# Patient Record
Sex: Female | Born: 1980 | Race: Black or African American | Hispanic: No | Marital: Married | Smoking: Never smoker
Health system: Southern US, Community
[De-identification: ages and names within clinical notes are randomized; demographics above are authoritative.]

## PROBLEM LIST (undated history)

## (undated) DIAGNOSIS — I509 Heart failure, unspecified: Secondary | ICD-10-CM

## (undated) DIAGNOSIS — M311 Thrombotic microangiopathy: Secondary | ICD-10-CM

## (undated) DIAGNOSIS — Z5189 Encounter for other specified aftercare: Secondary | ICD-10-CM

## (undated) DIAGNOSIS — I10 Essential (primary) hypertension: Secondary | ICD-10-CM

## (undated) DIAGNOSIS — M329 Systemic lupus erythematosus, unspecified: Secondary | ICD-10-CM

## (undated) DIAGNOSIS — M3119 Other thrombotic microangiopathy: Secondary | ICD-10-CM

## (undated) DIAGNOSIS — R569 Unspecified convulsions: Secondary | ICD-10-CM

---

## 2001-11-09 ENCOUNTER — Emergency Department (HOSPITAL_COMMUNITY): Admission: EM | Admit: 2001-11-09 | Discharge: 2001-11-10 | Payer: Self-pay | Admitting: Emergency Medicine

## 2009-03-04 ENCOUNTER — Ambulatory Visit: Payer: Self-pay | Admitting: Family Medicine

## 2011-05-09 ENCOUNTER — Encounter (HOSPITAL_BASED_OUTPATIENT_CLINIC_OR_DEPARTMENT_OTHER): Payer: Self-pay | Admitting: *Deleted

## 2011-05-09 ENCOUNTER — Emergency Department (HOSPITAL_BASED_OUTPATIENT_CLINIC_OR_DEPARTMENT_OTHER)
Admission: EM | Admit: 2011-05-09 | Discharge: 2011-05-09 | Disposition: A | Discharge status: Home / Self Care | Payer: Self-pay | Attending: Emergency Medicine | Admitting: Emergency Medicine

## 2011-05-09 DIAGNOSIS — M329 Systemic lupus erythematosus, unspecified: Secondary | ICD-10-CM | POA: Insufficient documentation

## 2011-05-09 DIAGNOSIS — I509 Heart failure, unspecified: Secondary | ICD-10-CM | POA: Insufficient documentation

## 2011-05-09 DIAGNOSIS — I10 Essential (primary) hypertension: Secondary | ICD-10-CM | POA: Insufficient documentation

## 2011-05-09 DIAGNOSIS — Z79899 Other long term (current) drug therapy: Secondary | ICD-10-CM | POA: Insufficient documentation

## 2011-05-09 DIAGNOSIS — J029 Acute pharyngitis, unspecified: Secondary | ICD-10-CM | POA: Insufficient documentation

## 2011-05-09 HISTORY — DX: Essential (primary) hypertension: I10

## 2011-05-09 HISTORY — DX: Heart failure, unspecified: I50.9

## 2011-05-09 HISTORY — DX: Systemic lupus erythematosus, unspecified: M32.9

## 2011-05-09 LAB — RAPID STREP SCREEN (MED CTR MEBANE ONLY): Streptococcus, Group A Screen (Direct): NEGATIVE

## 2011-05-09 MED ORDER — CLINDAMYCIN HCL 150 MG PO CAPS: 150.0000 mg | ORAL_CAPSULE | Freq: Four times a day (QID) | ORAL | Status: AC

## 2011-05-09 MED ORDER — HYDROCODONE-ACETAMINOPHEN 7.5-500 MG/15ML PO SOLN
10.0000 mL | Freq: Once | ORAL | Status: AC
  Administered 2011-05-09: 10 mL via ORAL
  Filled 2011-05-09: qty 15

## 2011-05-09 NOTE — ED Provider Notes (Signed)
History     CSN: [id number]  Arrival date & time 05/09/11  2236   First MD Initiated Contact with Patient 05/09/11 2301      Chief Complaint  Patient presents with  . Sore Throat    The history is provided by the patient.   the patient for 6 days of sore throat with pain with swallowing.  She denies fevers or chills.  She denies nausea vomiting or diarrhea.  She's been able tolerate fluids without difficulty.  She reports no changes in her voice.  Nothing worsens her symptoms.  Nothing improves her symptoms.  Her symptoms are constant.  She's denies neck pain and neck lymphadenopathy.  She has no dental complaints.   Past Medical History  Diagnosis Date  . Lupus   . CHF (congestive heart failure)   . Hypertension     History reviewed. No pertinent past surgical history.  History reviewed. No pertinent family history.  History  Substance Use Topics  . Smoking status: Never Smoker   . Smokeless tobacco: Not on file  . Alcohol Use: No    OB History    Grav Para Term Preterm Abortions TAB SAB Ect Mult Living                  Review of Systems  All other systems reviewed and are negative.    Allergies  Morphine and related  Home Medications   Current Outpatient Rx  Name Route Sig Dispense Refill  . ASPIRIN 81 MG PO TABS Oral Take 81 mg by mouth daily.    Marland Kitchen HYDROXYCHLOROQUINE SULFATE 200 MG PO TABS Oral Take 400 mg by mouth daily.    Marland Kitchen LISINOPRIL 10 MG PO TABS Oral Take 10 mg by mouth daily.    . ADULT MULTIVITAMIN W/MINERALS CH Oral Take 1 tablet by mouth daily.    Marland Kitchen MYCOPHENOLATE MOFETIL 500 MG PO TABS Oral Take 1,000 mg by mouth 2 (two) times daily.    Marland Kitchen NAPROXEN SODIUM 220 MG PO TABS Oral Take 440 mg by mouth daily as needed. For pain    . NITROFURANTOIN MONOHYD MACRO 100 MG PO CAPS Oral Take 100 mg by mouth daily. For 14 days starting 05/03/2011    . PREDNISONE 2.5 MG PO TABS Oral Take 7.5 mg by mouth daily.    Marland Kitchen CLINDAMYCIN HCL 150 MG PO CAPS Oral Take 1  capsule (150 mg total) by mouth every 6 (six) hours. 28 capsule 0    BP 118/80  Pulse 85  Temp(Src) 98.7 F (37.1 C) (Oral)  Resp 16  Ht 5\' 9"  (1.753 m)  Wt 166 lb (75.297 kg)  BMI 24.51 kg/m2  SpO2 100%  LMP 05/02/2011  Physical Exam  Nursing note and vitals reviewed. Constitutional: She is oriented to person, place, and time. She appears well-developed and well-nourished. No distress.  HENT:  Head: Normocephalic and atraumatic.  Right Ear: Tympanic membrane, external ear and ear canal normal.  Left Ear: Tympanic membrane, external ear and ear canal normal.  Mouth/Throat: Uvula is midline and mucous membranes are normal. Mucous membranes are not dry. Dental caries present. No dental abscesses. Posterior oropharyngeal erythema present. No oropharyngeal exudate, posterior oropharyngeal edema or tonsillar abscesses.  Eyes: EOM are normal.  Neck: Normal range of motion.  Cardiovascular: Normal rate, regular rhythm and normal heart sounds.   Pulmonary/Chest: Effort normal and breath sounds normal.  Abdominal: Soft. She exhibits no distension. There is no tenderness.  Musculoskeletal: Normal range of motion.  Neurological: She is alert and oriented to person, place, and time.  Skin: Skin is warm and dry.  Psychiatric: She has a normal mood and affect. Judgment normal.    ED Course  Procedures (including critical care time)   Labs Reviewed  RAPID STREP SCREEN   No results found.   1. Pharyngitis       MDM  We'll cover the patient for possible early bacterial pharyngitis.  Clindamycin given.  Pain treated.  Uvula midline.  No evidence of peritonsillar abscess.  Tolerating secretions         Lyanne Co, MD 05/10/11 [phone]

## 2011-05-09 NOTE — ED Notes (Signed)
Pt c/o sore throat x 6 days

## 2012-02-05 ENCOUNTER — Encounter (HOSPITAL_BASED_OUTPATIENT_CLINIC_OR_DEPARTMENT_OTHER): Payer: Self-pay | Admitting: *Deleted

## 2012-02-05 ENCOUNTER — Emergency Department (HOSPITAL_BASED_OUTPATIENT_CLINIC_OR_DEPARTMENT_OTHER)
Admission: EM | Admit: 2012-02-05 | Discharge: 2012-02-05 | Disposition: A | Discharge status: Home / Self Care | Payer: Self-pay | Attending: Emergency Medicine | Admitting: Emergency Medicine

## 2012-02-05 ENCOUNTER — Emergency Department (HOSPITAL_BASED_OUTPATIENT_CLINIC_OR_DEPARTMENT_OTHER): Payer: Self-pay

## 2012-02-05 DIAGNOSIS — R509 Fever, unspecified: Secondary | ICD-10-CM | POA: Insufficient documentation

## 2012-02-05 DIAGNOSIS — R059 Cough, unspecified: Secondary | ICD-10-CM | POA: Insufficient documentation

## 2012-02-05 DIAGNOSIS — I509 Heart failure, unspecified: Secondary | ICD-10-CM | POA: Insufficient documentation

## 2012-02-05 DIAGNOSIS — IMO0002 Reserved for concepts with insufficient information to code with codable children: Secondary | ICD-10-CM | POA: Insufficient documentation

## 2012-02-05 DIAGNOSIS — J189 Pneumonia, unspecified organism: Secondary | ICD-10-CM | POA: Insufficient documentation

## 2012-02-05 DIAGNOSIS — Z7982 Long term (current) use of aspirin: Secondary | ICD-10-CM | POA: Insufficient documentation

## 2012-02-05 DIAGNOSIS — I10 Essential (primary) hypertension: Secondary | ICD-10-CM | POA: Insufficient documentation

## 2012-02-05 DIAGNOSIS — Z79899 Other long term (current) drug therapy: Secondary | ICD-10-CM | POA: Insufficient documentation

## 2012-02-05 DIAGNOSIS — J029 Acute pharyngitis, unspecified: Secondary | ICD-10-CM | POA: Insufficient documentation

## 2012-02-05 DIAGNOSIS — R05 Cough: Secondary | ICD-10-CM | POA: Insufficient documentation

## 2012-02-05 LAB — CBC WITH DIFFERENTIAL/PLATELET
Basophils Absolute: 0 10*3/uL (ref 0.0–0.1)
Eosinophils Absolute: 0.1 10*3/uL (ref 0.0–0.7)
Lymphocytes Relative: 10 % — ABNORMAL LOW (ref 12–46)
MCHC: 32.9 g/dL (ref 30.0–36.0)
Neutro Abs: 2.4 10*3/uL (ref 1.7–7.7)
Neutrophils Relative %: 80 % — ABNORMAL HIGH (ref 43–77)
Platelets: 199 10*3/uL (ref 150–400)
RDW: 16.9 % — ABNORMAL HIGH (ref 11.5–15.5)
WBC: 3 10*3/uL — ABNORMAL LOW (ref 4.0–10.5)

## 2012-02-05 LAB — COMPREHENSIVE METABOLIC PANEL
ALT: 13 U/L (ref 0–35)
AST: 32 U/L (ref 0–37)
Albumin: 2.8 g/dL — ABNORMAL LOW (ref 3.5–5.2)
Chloride: 105 mEq/L (ref 96–112)
Creatinine, Ser: 0.6 mg/dL (ref 0.50–1.10)
Potassium: 3.6 mEq/L (ref 3.5–5.1)
Sodium: 139 mEq/L (ref 135–145)
Total Bilirubin: 0.2 mg/dL — ABNORMAL LOW (ref 0.3–1.2)

## 2012-02-05 LAB — RAPID STREP SCREEN (MED CTR MEBANE ONLY): Streptococcus, Group A Screen (Direct): NEGATIVE

## 2012-02-05 MED ORDER — SODIUM CHLORIDE 0.9 % IV SOLN
Freq: Once | INTRAVENOUS | Status: AC
  Administered 2012-02-05: 22:00:00 via INTRAVENOUS

## 2012-02-05 MED ORDER — DEXTROSE 5 % IV SOLN
1.0000 g | INTRAVENOUS | Status: DC
  Administered 2012-02-05: 1 g via INTRAVENOUS
  Filled 2012-02-05: qty 10

## 2012-02-05 MED ORDER — DIPHENHYDRAMINE HCL 50 MG/ML IJ SOLN
25.0000 mg | Freq: Once | INTRAMUSCULAR | Status: AC
  Administered 2012-02-05: 25 mg via INTRAVENOUS

## 2012-02-05 MED ORDER — OXYCODONE-ACETAMINOPHEN 5-325 MG PO TABS
2.0000 | ORAL_TABLET | Freq: Once | ORAL | Status: AC
  Administered 2012-02-05: 2 via ORAL
  Filled 2012-02-05: qty 2

## 2012-02-05 MED ORDER — OXYCODONE-ACETAMINOPHEN 5-325 MG PO TABS
ORAL_TABLET | ORAL | Status: AC
  Administered 2012-02-05: 2 via ORAL
  Filled 2012-02-05: qty 2

## 2012-02-05 MED ORDER — AZITHROMYCIN 250 MG PO TABS: 250.0000 mg | ORAL_TABLET | Freq: Every day | ORAL | Status: DC

## 2012-02-05 MED ORDER — DIPHENHYDRAMINE HCL 50 MG/ML IJ SOLN
INTRAMUSCULAR | Status: AC
  Administered 2012-02-05: 25 mg via INTRAVENOUS
  Filled 2012-02-05: qty 1

## 2012-02-05 MED ORDER — OXYCODONE-ACETAMINOPHEN 5-325 MG PO TABS: 2.0000 | ORAL_TABLET | ORAL | Status: DC | PRN

## 2012-02-05 MED ORDER — DEXTROSE 5 % IV SOLN
500.0000 mg | INTRAVENOUS | Status: DC
  Administered 2012-02-05: 500 mg via INTRAVENOUS
  Filled 2012-02-05: qty 500

## 2012-02-05 NOTE — ED Notes (Signed)
Pt given ice water per request.

## 2012-02-05 NOTE — ED Notes (Signed)
Lupus flare-up. Sore throat. Aching all over. Symptoms since yesterday. Worse today.

## 2012-02-05 NOTE — ED Notes (Signed)
Pt returned from radiology via stretcher

## 2012-02-05 NOTE — ED Provider Notes (Signed)
History     CSN: [id number]  Arrival date & time 02/05/12  1839   None     Chief Complaint  Patient presents with  . Lupus    (Consider location/radiation/quality/duration/timing/severity/associated sxs/prior treatment) Patient is a 31 y.o. female presenting with cough. The history is provided by the patient. No language interpreter was used.  Cough This is a new problem. The current episode started yesterday. The problem occurs constantly. The problem has been gradually worsening. The cough is non-productive. The maximum temperature recorded prior to her arrival was 100 to 100.9 F. The fever has been present for less than 1 day. Associated symptoms include sore throat. She has tried nothing for the symptoms. She is not a smoker. Her past medical history does not include pneumonia.  Pt complains of a sore throat and cough  Past Medical History  Diagnosis Date  . Lupus   . CHF (congestive heart failure)   . Hypertension     History reviewed. No pertinent past surgical history.  No family history on file.  History  Substance Use Topics  . Smoking status: Never Smoker   . Smokeless tobacco: Not on file  . Alcohol Use: No    OB History    Grav Para Term Preterm Abortions TAB SAB Ect Mult Living                  Review of Systems  HENT: Positive for sore throat.   Respiratory: Positive for cough.   All other systems reviewed and are negative.    Allergies  Morphine and related  Home Medications   Current Outpatient Rx  Name  Route  Sig  Dispense  Refill  . ASPIRIN 81 MG PO TABS   Oral   Take 81 mg by mouth daily.         Marland Kitchen HYDROXYCHLOROQUINE SULFATE 200 MG PO TABS   Oral   Take 400 mg by mouth daily.         Marland Kitchen LISINOPRIL 10 MG PO TABS   Oral   Take 10 mg by mouth daily.         . ADULT MULTIVITAMIN W/MINERALS CH   Oral   Take 1 tablet by mouth daily.         Marland Kitchen MYCOPHENOLATE MOFETIL 500 MG PO TABS   Oral   Take 1,000 mg by mouth 2 (two)  times daily.         Marland Kitchen NAPROXEN SODIUM 220 MG PO TABS   Oral   Take 440 mg by mouth daily as needed. For pain         . NITROFURANTOIN MONOHYD MACRO 100 MG PO CAPS   Oral   Take 100 mg by mouth daily. For 14 days starting 05/03/2011         . PREDNISONE 2.5 MG PO TABS   Oral   Take 7.5 mg by mouth daily.           BP 102/64  Pulse 108  Temp 100.4 F (38 C) (Oral)  Resp 20  SpO2 100%  Physical Exam  Nursing note and vitals reviewed. Constitutional: She is oriented to person, place, and time. She appears well-developed and well-nourished.  HENT:  Head: Normocephalic and atraumatic.  Eyes: Conjunctivae normal and EOM are normal. Pupils are equal, round, and reactive to light.  Neck: Normal range of motion. Neck supple.  Cardiovascular: Normal rate and normal heart sounds.   Pulmonary/Chest: Effort normal and breath sounds normal.  Abdominal: Soft.  Musculoskeletal: Normal range of motion.  Neurological: She is alert and oriented to person, place, and time. She has normal reflexes.  Skin: Skin is warm.  Psychiatric: She has a normal mood and affect.    ED Course  Procedures (including critical care time)  Labs Reviewed - No data to display No results found.   No diagnosis found.    MDM   Results for orders placed during the hospital encounter of 02/05/12  RAPID STREP SCREEN      Component Value Range   Streptococcus, Group A Screen (Direct) NEGATIVE  NEGATIVE  CBC WITH DIFFERENTIAL      Component Value Range   WBC 3.0 (*) 4.0 - 10.5 K/uL   RBC 3.04 (*) 3.87 - 5.11 MIL/uL   Hemoglobin 8.0 (*) 12.0 - 15.0 g/dL   HCT 19.1 (*) 47.8 - 29.5 %   MCV 79.9  78.0 - 100.0 fL   MCH 26.3  26.0 - 34.0 pg   MCHC 32.9  30.0 - 36.0 g/dL   RDW 62.1 (*) 30.8 - 65.7 %   Platelets 199  150 - 400 K/uL   Neutrophils Relative 80 (*) 43 - 77 %   Lymphocytes Relative 10 (*) 12 - 46 %   Monocytes Relative 6  3 - 12 %   Eosinophils Relative 4  0 - 5 %   Basophils  Relative 0  0 - 1 %   Neutro Abs 2.4  1.7 - 7.7 K/uL   Lymphs Abs 0.3 (*) 0.7 - 4.0 K/uL   Monocytes Absolute 0.2  0.1 - 1.0 K/uL   Eosinophils Absolute 0.1  0.0 - 0.7 K/uL   Basophils Absolute 0.0  0.0 - 0.1 K/uL  COMPREHENSIVE METABOLIC PANEL      Component Value Range   Sodium 139  135 - 145 mEq/L   Potassium 3.6  3.5 - 5.1 mEq/L   Chloride 105  96 - 112 mEq/L   CO2 24  19 - 32 mEq/L   Glucose, Bld 108 (*) 70 - 99 mg/dL   BUN 6  6 - 23 mg/dL   Creatinine, Ser 8.46  0.50 - 1.10 mg/dL   Calcium 8.2 (*) 8.4 - 10.5 mg/dL   Total Protein 7.8  6.0 - 8.3 g/dL   Albumin 2.8 (*) 3.5 - 5.2 g/dL   AST 32  0 - 37 U/L   ALT 13  0 - 35 U/L   Alkaline Phosphatase 50  39 - 117 U/L   Total Bilirubin 0.2 (*) 0.3 - 1.2 mg/dL   GFR calc non Af Amer >90  >90 mL/min   GFR calc Af Amer >90  >90 mL/min   Dg Chest 2 View  02/05/2012  *RADIOLOGY REPORT*  Clinical Data: Cough and congestion.  CHEST - 2 VIEW  Comparison: No priors.  Findings: Ill-defined peripheral opacity in the right base difficult to localize on the lateral projection.  Blunting of the left costophrenic sulcus suggestive of a peripherally loculated left pleural effusion.  Pulmonary vasculature is normal.  Heart size is normal.  Mediastinal contours are unremarkable.  IMPRESSION: 1.  Ill-defined opacity in the lateral aspect of the right base may represent an area of early airspace consolidation or scarring in either the lateral segment of the right middle lobe or the lateral aspect of the right lower lobe. 2. Small peripherally loculated left pleural effusion.   Original Report Authenticated By: Trudie Reed, M.D.       Pt given  IV rocephin and zithromax IV.   I advised follow up with her Md for recheck in the next 2 days.  Pt given rx for zithromax and percocet.  Pt advised to return if symptoms worsen or change     Elson Areas, [place] 02/05/12 2317  Lonia Skinner [place], [place] 02/05/12 2318

## 2012-02-06 NOTE — ED Provider Notes (Signed)
Medical screening examination/treatment/procedure(s) were performed by non-physician practitioner and as supervising physician I was immediately available for consultation/collaboration.   Maverick Patman, MD 02/06/12 0017 

## 2012-02-16 ENCOUNTER — Emergency Department (HOSPITAL_BASED_OUTPATIENT_CLINIC_OR_DEPARTMENT_OTHER)
Admission: EM | Admit: 2012-02-16 | Discharge: 2012-02-17 | Disposition: A | Discharge status: Home / Self Care | Payer: Self-pay | Attending: Emergency Medicine | Admitting: Emergency Medicine

## 2012-02-16 ENCOUNTER — Encounter (HOSPITAL_BASED_OUTPATIENT_CLINIC_OR_DEPARTMENT_OTHER): Payer: Self-pay | Admitting: *Deleted

## 2012-02-16 ENCOUNTER — Emergency Department (HOSPITAL_BASED_OUTPATIENT_CLINIC_OR_DEPARTMENT_OTHER): Payer: Self-pay

## 2012-02-16 DIAGNOSIS — G40909 Epilepsy, unspecified, not intractable, without status epilepticus: Secondary | ICD-10-CM | POA: Insufficient documentation

## 2012-02-16 DIAGNOSIS — R110 Nausea: Secondary | ICD-10-CM | POA: Insufficient documentation

## 2012-02-16 DIAGNOSIS — Z7982 Long term (current) use of aspirin: Secondary | ICD-10-CM | POA: Insufficient documentation

## 2012-02-16 DIAGNOSIS — R6883 Chills (without fever): Secondary | ICD-10-CM | POA: Insufficient documentation

## 2012-02-16 DIAGNOSIS — J189 Pneumonia, unspecified organism: Secondary | ICD-10-CM

## 2012-02-16 DIAGNOSIS — Z79899 Other long term (current) drug therapy: Secondary | ICD-10-CM | POA: Insufficient documentation

## 2012-02-16 DIAGNOSIS — R05 Cough: Secondary | ICD-10-CM | POA: Insufficient documentation

## 2012-02-16 DIAGNOSIS — J159 Unspecified bacterial pneumonia: Secondary | ICD-10-CM | POA: Insufficient documentation

## 2012-02-16 DIAGNOSIS — Z862 Personal history of diseases of the blood and blood-forming organs and certain disorders involving the immune mechanism: Secondary | ICD-10-CM | POA: Insufficient documentation

## 2012-02-16 DIAGNOSIS — R000 Tachycardia, unspecified: Secondary | ICD-10-CM | POA: Insufficient documentation

## 2012-02-16 DIAGNOSIS — I10 Essential (primary) hypertension: Secondary | ICD-10-CM | POA: Insufficient documentation

## 2012-02-16 DIAGNOSIS — IMO0001 Reserved for inherently not codable concepts without codable children: Secondary | ICD-10-CM | POA: Insufficient documentation

## 2012-02-16 DIAGNOSIS — R059 Cough, unspecified: Secondary | ICD-10-CM | POA: Insufficient documentation

## 2012-02-16 DIAGNOSIS — I509 Heart failure, unspecified: Secondary | ICD-10-CM | POA: Insufficient documentation

## 2012-02-16 DIAGNOSIS — M329 Systemic lupus erythematosus, unspecified: Secondary | ICD-10-CM | POA: Insufficient documentation

## 2012-02-16 HISTORY — DX: Thrombotic microangiopathy: M31.1

## 2012-02-16 HISTORY — DX: Other thrombotic microangiopathy: M31.19

## 2012-02-16 HISTORY — DX: Unspecified convulsions: R56.9

## 2012-02-16 HISTORY — DX: Encounter for other specified aftercare: Z51.89

## 2012-02-16 LAB — URINALYSIS, ROUTINE W REFLEX MICROSCOPIC
Glucose, UA: NEGATIVE mg/dL
Protein, ur: 100 mg/dL — AB
pH: 6.5 (ref 5.0–8.0)

## 2012-02-16 LAB — CBC WITH DIFFERENTIAL/PLATELET
Basophils Relative: 0 % (ref 0–1)
Eosinophils Relative: 2 % (ref 0–5)
Lymphs Abs: 0.5 10*3/uL — ABNORMAL LOW (ref 0.7–4.0)
MCH: 26.4 pg (ref 26.0–34.0)
MCV: 79.4 fL (ref 78.0–100.0)
Monocytes Absolute: 0.1 10*3/uL (ref 0.1–1.0)
Platelets: 281 10*3/uL (ref 150–400)
RBC: 3.49 MIL/uL — ABNORMAL LOW (ref 3.87–5.11)

## 2012-02-16 LAB — COMPREHENSIVE METABOLIC PANEL
Alkaline Phosphatase: 45 U/L (ref 39–117)
BUN: 11 mg/dL (ref 6–23)
Chloride: 101 mEq/L (ref 96–112)
GFR calc Af Amer: >90 mL/min (ref >90)
Glucose, Bld: 80 mg/dL (ref 70–99)
Potassium: 4 mEq/L (ref 3.5–5.1)
Total Bilirubin: 0.3 mg/dL (ref 0.3–1.2)

## 2012-02-16 LAB — URINE MICROSCOPIC-ADD ON

## 2012-02-16 LAB — LACTIC ACID, PLASMA: Lactic Acid, Venous: 1.2 mmol/L (ref 0.5–2.2)

## 2012-02-16 MED ORDER — LEVOFLOXACIN IN D5W 750 MG/150ML IV SOLN
750.0000 mg | Freq: Once | INTRAVENOUS | Status: DC
  Filled 2012-02-16: qty 150

## 2012-02-16 MED ORDER — ACETAMINOPHEN 325 MG PO TABS
ORAL_TABLET | ORAL | Status: AC
  Administered 2012-02-16: 650 mg via ORAL
  Filled 2012-02-16: qty 2

## 2012-02-16 MED ORDER — LEVOFLOXACIN 750 MG PO TABS: 750.0000 mg | ORAL_TABLET | Freq: Every day | ORAL | Status: DC

## 2012-02-16 MED ORDER — DIPHENHYDRAMINE HCL 25 MG PO CAPS
25.0000 mg | ORAL_CAPSULE | Freq: Once | ORAL | Status: AC
  Administered 2012-02-16: 25 mg via ORAL
  Filled 2012-02-16: qty 1

## 2012-02-16 MED ORDER — SODIUM CHLORIDE 0.9 % IV BOLUS (SEPSIS)
1000.0000 mL | Freq: Once | INTRAVENOUS | Status: AC
  Administered 2012-02-16: 1000 mL via INTRAVENOUS

## 2012-02-16 MED ORDER — CEFTRIAXONE SODIUM 1 G IJ SOLR: 1.0000 g | Freq: Once | INTRAMUSCULAR | Status: DC

## 2012-02-16 MED ORDER — KETOROLAC TROMETHAMINE 30 MG/ML IJ SOLN
30.0000 mg | Freq: Once | INTRAMUSCULAR | Status: AC
  Administered 2012-02-16: 30 mg via INTRAVENOUS
  Filled 2012-02-16: qty 1

## 2012-02-16 MED ORDER — ACETAMINOPHEN 325 MG PO TABS
650.0000 mg | ORAL_TABLET | Freq: Once | ORAL | Status: AC
  Administered 2012-02-16: 650 mg via ORAL

## 2012-02-16 MED ORDER — DEXTROSE 5 % IV SOLN: 500.0000 mg | Freq: Once | INTRAVENOUS | Status: DC

## 2012-02-16 NOTE — ED Notes (Signed)
Fever and bodyaches x 1 days- nausea, denies vomiting

## 2012-02-16 NOTE — ED Provider Notes (Signed)
History     CSN: [id number]  Arrival date & time 02/16/12  2048   First MD Initiated Contact with Patient 02/16/12 2107      Chief Complaint  Patient presents with  . Fever    (Consider location/radiation/quality/duration/timing/severity/associated sxs/prior treatment) The history is provided by the patient.  Suzanne Tyler is a 31 y.o. female hx of lupus on plaquinel and Cellcept, TTP, pericardial effusion and CHF that resolved, here with fever. Subjective fever today. She also has myalgias. She has some nonproductive cough and felt nauseous but didn't vomit. No diarrhea or abdominal pain. Was recently treated for possible pneumonia and finished zithromax a week ago. No headache or stiff neck or recent travel.    Past Medical History  Diagnosis Date  . Lupus   . CHF (congestive heart failure)   . Hypertension   . T.T.P. syndrome   . Blood transfusion without reported diagnosis   . Seizures     History reviewed. No pertinent past surgical history.  No family history on file.  History  Substance Use Topics  . Smoking status: Never Smoker   . Smokeless tobacco: Never Used  . Alcohol Use: No    OB History    Grav Para Term Preterm Abortions TAB SAB Ect Mult Living                  Review of Systems  Constitutional: Positive for fever, chills and fatigue.  Respiratory: Positive for cough.   Gastrointestinal: Positive for nausea.  All other systems reviewed and are negative.    Allergies  Morphine and related  Home Medications   Current Outpatient Rx  Name  Route  Sig  Dispense  Refill  . ASPIRIN 81 MG PO TABS   Oral   Take 81 mg by mouth daily.         Marland Kitchen HYDROXYCHLOROQUINE SULFATE 200 MG PO TABS   Oral   Take 400 mg by mouth daily.         Marland Kitchen LISINOPRIL 10 MG PO TABS   Oral   Take 10 mg by mouth daily.         . ADULT MULTIVITAMIN W/MINERALS CH   Oral   Take 1 tablet by mouth daily.         Marland Kitchen MYCOPHENOLATE MOFETIL 500 MG PO TABS  Oral   Take 1,000 mg by mouth 2 (two) times daily.         Marland Kitchen NAPROXEN SODIUM 220 MG PO TABS   Oral   Take 440 mg by mouth daily as needed. For pain         . OXYCODONE-ACETAMINOPHEN 5-325 MG PO TABS   Oral   Take 2 tablets by mouth every 4 (four) hours as needed for pain.   16 tablet   0   . PREDNISONE 2.5 MG PO TABS   Oral   Take 7.5 mg by mouth daily.         . AZITHROMYCIN 250 MG PO TABS   Oral   Take 1 tablet (250 mg total) by mouth daily.   4 each   0   . NITROFURANTOIN MONOHYD MACRO 100 MG PO CAPS   Oral   Take 100 mg by mouth daily. For 14 days starting 05/03/2011           BP 100/60  Pulse 131  Temp 102.9 F (39.4 C) (Oral)  Resp 20  Ht 5\' 9"  (1.753 m)  Wt 166 lb (75.297 kg)  BMI 24.51 kg/m2  SpO2 99%  LMP 01/29/2012  Physical Exam  Nursing note and vitals reviewed. Constitutional: She is oriented to person, place, and time. She appears well-developed and well-nourished.       Uncomfortable, shivering.   HENT:  Head: Normocephalic.  Mouth/Throat: Oropharynx is clear and moist.  Eyes: Conjunctivae normal are normal. Pupils are equal, round, and reactive to light.  Neck: Normal range of motion. Neck supple.  Cardiovascular: Regular rhythm and normal heart sounds.        Tachycardic   Pulmonary/Chest: Effort normal and breath sounds normal. No respiratory distress. She has no wheezes. She has no rales.  Abdominal: Soft. Bowel sounds are normal. She exhibits no distension. There is no tenderness. There is no rebound.  Musculoskeletal: Normal range of motion. She exhibits no edema and no tenderness.  Neurological: She is alert and oriented to person, place, and time.  Skin: Skin is warm and dry.  Psychiatric: She has a normal mood and affect. Her behavior is normal. Judgment and thought content normal.    ED Course  Procedures (including critical care time)  Labs Reviewed  CBC WITH DIFFERENTIAL - Abnormal; Notable for the following:    WBC 2.7  (*)     RBC 3.49 (*)     Hemoglobin 9.2 (*)     HCT 27.7 (*)     RDW 17.7 (*)     Lymphs Abs 0.5 (*)     All other components within normal limits  COMPREHENSIVE METABOLIC PANEL - Abnormal; Notable for the following:    Total Protein 8.4 (*)     Albumin 3.1 (*)     All other components within normal limits  URINALYSIS, ROUTINE W REFLEX MICROSCOPIC - Abnormal; Notable for the following:    APPearance CLOUDY (*)     Bilirubin Urine SMALL (*)     Ketones, ur 15 (*)     Protein, ur 100 (*)     Leukocytes, UA SMALL (*)     All other components within normal limits  URINE MICROSCOPIC-ADD ON - Abnormal; Notable for the following:    Squamous Epithelial / LPF FEW (*)     Bacteria, UA FEW (*)     All other components within normal limits  LACTIC ACID, PLASMA  URINE CULTURE  CULTURE, BLOOD (ROUTINE X 2)  CULTURE, BLOOD (ROUTINE X 2)   Dg Chest 2 View  02/16/2012  *RADIOLOGY REPORT*  Clinical Data: Fever and myalgias.  Nausea.  CHEST - 2 VIEW  Comparison: 02/05/2012.  Findings: Normal sized heart.  Minimal patchy opacity at the left lung base, laterally, with improvement.  Small amount of adjacent pleural thickening or fluid, also improved.  Minimal patchy opacity at the right lung base partially obscuring the more focal area of increased density previously seen at the lateral right lung base. Minimal positional scoliosis.  IMPRESSION:  1.  Interval minimal probable pneumonia at the right lung base. 2.  Mildly improved pneumonia/atelectasis at the left lateral lung base with decreased adjacent loculated left pleural fluid.   Original Report Authenticated By: Beckie Salts, M.D.      No diagnosis found.    MDM  Suzanne Tyler is a 31 y.o. female here with fever, tachycardia, and hypotension. Concerned for severe sepsis. Will do sepsis workup, give IVF and reassess.   11:34 PM Patient's labs showed WBC 2.7, otherwise nl. UA contaminated. CXR showed possible RLL pneumonia. Her BP increased  to 100/60 and she is no longer tachycardic.  Lactate is also normal. I offered her admission given her hypotension but she wants to try another course of outpatient abx for now. Will is given Levaquin IV in the ED. She will be d/c on course of levaquin. Return instructions given.         Richardean Canal, MD 02/16/12 [phone]

## 2012-02-16 NOTE — ED Notes (Signed)
MD at bedside. 

## 2012-02-16 NOTE — ED Notes (Signed)
Pt. Given crackers and drink with a cheese stick.

## 2012-02-23 LAB — CULTURE, BLOOD (ROUTINE X 2): Culture: NO GROWTH

## 2012-07-06 ENCOUNTER — Emergency Department (HOSPITAL_BASED_OUTPATIENT_CLINIC_OR_DEPARTMENT_OTHER)
Admission: EM | Admit: 2012-07-06 | Discharge: 2012-07-06 | Disposition: A | Discharge status: Home / Self Care | Payer: Self-pay | Attending: Emergency Medicine | Admitting: Emergency Medicine

## 2012-07-06 ENCOUNTER — Encounter (HOSPITAL_BASED_OUTPATIENT_CLINIC_OR_DEPARTMENT_OTHER): Payer: Self-pay | Admitting: Emergency Medicine

## 2012-07-06 DIAGNOSIS — Z79899 Other long term (current) drug therapy: Secondary | ICD-10-CM | POA: Insufficient documentation

## 2012-07-06 DIAGNOSIS — Z7982 Long term (current) use of aspirin: Secondary | ICD-10-CM | POA: Insufficient documentation

## 2012-07-06 DIAGNOSIS — I10 Essential (primary) hypertension: Secondary | ICD-10-CM | POA: Insufficient documentation

## 2012-07-06 DIAGNOSIS — R52 Pain, unspecified: Secondary | ICD-10-CM | POA: Insufficient documentation

## 2012-07-06 DIAGNOSIS — Z8679 Personal history of other diseases of the circulatory system: Secondary | ICD-10-CM | POA: Insufficient documentation

## 2012-07-06 DIAGNOSIS — M329 Systemic lupus erythematosus, unspecified: Secondary | ICD-10-CM | POA: Insufficient documentation

## 2012-07-06 DIAGNOSIS — Z8669 Personal history of other diseases of the nervous system and sense organs: Secondary | ICD-10-CM | POA: Insufficient documentation

## 2012-07-06 DIAGNOSIS — I509 Heart failure, unspecified: Secondary | ICD-10-CM | POA: Insufficient documentation

## 2012-07-06 MED ORDER — HYDROCODONE-ACETAMINOPHEN 5-325 MG PO TABS
1.0000 | ORAL_TABLET | Freq: Once | ORAL | Status: AC
  Administered 2012-07-06: 1 via ORAL
  Filled 2012-07-06: qty 1

## 2012-07-06 MED ORDER — HYDROCODONE-ACETAMINOPHEN 5-325 MG PO TABS: 1.0000 | ORAL_TABLET | Freq: Four times a day (QID) | ORAL | Status: AC | PRN

## 2012-07-06 NOTE — ED Provider Notes (Signed)
History     CSN: [id number]  Arrival date & time 07/06/12  4098   First MD Initiated Contact with Patient 07/06/12 0815      Chief Complaint  Patient presents with  . Lupus  . Generalized pain     (Consider location/radiation/quality/duration/timing/severity/associated sxs/prior treatment) The history is provided by the patient.   32 year old female known history of this followed by rheumatology and primary care [facility] hospital. Presents with Lupus flare with bodyaches all over. Started last night. States pain at worst is 10 out of 10 currently more 6/10. No fever no shortness of breath no rash. Patient states that all her joints ache.  Past Medical History  Diagnosis Date  . Lupus   . CHF (congestive heart failure)   . Hypertension   . T.T.P. syndrome   . Blood transfusion without reported diagnosis   . Seizures     No past surgical history on file.  No family history on file.  History  Substance Use Topics  . Smoking status: Never Smoker   . Smokeless tobacco: Never Used  . Alcohol Use: No    OB History   Grav Para Term Preterm Abortions TAB SAB Ect Mult Living                  Review of Systems  Constitutional: Negative for fever.  HENT: Negative for congestion.   Eyes: Negative for visual disturbance.  Respiratory: Negative for shortness of breath.   Gastrointestinal: Negative for nausea, vomiting and abdominal pain.  Genitourinary: Negative for dysuria.  Musculoskeletal: Positive for myalgias.  Skin: Negative for rash.  Neurological: Negative for headaches.  Hematological: Does not bruise/bleed easily.  Psychiatric/Behavioral: Negative for confusion.    Allergies  Morphine and related  Home Medications   Current Outpatient Rx  Name  Route  Sig  Dispense  Refill  . furosemide (LASIX) 20 MG tablet   Oral   Take 20 mg by mouth 2 (two) times daily.         Marland Kitchen aspirin 81 MG tablet   Oral   Take 81 mg by mouth daily.         Marland Kitchen  HYDROcodone-acetaminophen (NORCO/VICODIN) 5-325 MG per tablet   Oral   Take 1-2 tablets by mouth every 6 (six) hours as needed for pain.   14 tablet   0   . hydroxychloroquine (PLAQUENIL) 200 MG tablet   Oral   Take 400 mg by mouth daily.         Marland Kitchen lisinopril (PRINIVIL,ZESTRIL) 10 MG tablet   Oral   Take 2.5 mg by mouth daily.          . Multiple Vitamin (MULITIVITAMIN WITH MINERALS) TABS   Oral   Take 1 tablet by mouth daily.         . mycophenolate (CELLCEPT) 500 MG tablet   Oral   Take 1,000 mg by mouth 2 (two) times daily.           BP 112/81  Pulse 125  Temp(Src) 98.5 F (36.9 C) (Oral)  Resp 16  Ht 5\' 9"  (1.753 m)  Wt 160 lb (72.576 kg)  BMI 23.62 kg/m2  SpO2 100%  LMP 07/04/2012  Physical Exam  Nursing note and vitals reviewed. Constitutional: She is oriented to person, place, and time. She appears well-developed and well-nourished. No distress.  HENT:  Head: Normocephalic and atraumatic.  Mouth/Throat: Oropharynx is clear and moist.  Very poor dentition.  Eyes: Conjunctivae and EOM are normal.  Pupils are equal, round, and reactive to light. No scleral icterus.  Neck: Normal range of motion. Neck supple.  Cardiovascular: Normal rate, regular rhythm and normal heart sounds.   No murmur heard. Slightly tachycardic.  Pulmonary/Chest: Effort normal and breath sounds normal. No respiratory distress. She has no wheezes. She has no rales.  Abdominal: Soft. Bowel sounds are normal. There is no tenderness.  Musculoskeletal: Normal range of motion. She exhibits no edema.  Neurological: She is alert and oriented to person, place, and time. No cranial nerve deficit. She exhibits normal muscle tone. Coordination normal.  Skin: Skin is warm. No rash noted.    ED Course  Procedures (including critical care time)  Labs Reviewed - No data to display No results found.   1. Lupus (systemic lupus erythematosus)       MDM  Patient is nontoxic no acute  distress. Complaint of generalized abdominal pain no fever no shortness of breath. Tachycardic at triage in the room patient's heart rate is upper 90s. No hypotension room air oxygen is 100%. Patient is allergic to morphine. We'll treat with hydrocodone with precautions she has followup at [facility]. Patient clinically stable additional workup not required at this time.        Shelda Jakes, MD 07/06/12 [phone]

## 2012-07-06 NOTE — ED Notes (Signed)
Pt states she is having a flare up of her lupus since last night.  Pt states she has generalized pain.  No known fever.

## 2012-07-06 NOTE — Discharge Instructions (Signed)
Lupus Lupus (also called systemic lupus erythematosus, SLE) is a disorder of the body's natural defense system (immune system). In lupus, the immune system attacks various areas of the body (autoimmune disease). CAUSES The cause is unknown. However, lupus runs in families. Certain genes can make you more likely to develop lupus. It is 10 times more common in women than in men. Lupus is also more common in African Americans and Asians. Other factors also play a role, such as viruses (Epstein-Barr virus, EBV), stress, hormones, cigarette smoke, and certain drugs. SYMPTOMS Lupus can affect many parts of the body, including the joints, skin, kidneys, lungs, heart, nervous system, and blood vessels. The signs and symptoms of lupus differ from person to person. The disease can range from mild to life-threatening. Typical features of lupus include:  Butterfly-shaped rash over the face.  Arthritis involving one or more joints.  Kidney disease.  Fever, weight loss, hair loss, fatigue.  Poor circulation in the fingers and toes (Raynaud's disease).  Chest pain when taking deep breaths. Abdominal pain may also occur.  Skin rash in areas exposed to the sun.  Sores in the mouth and nose. DIAGNOSIS Diagnosing lupus can take a long time and is often difficult. An exam and an accurate account of your symptoms and health problems is very important. Blood tests are necessary, though no single test can confirm or rule out lupus. Most people with lupus test positive for antinuclear antibodies (ANA) on a blood test. Additional blood tests, a urine test (urinalysis), and sometimes a kidney or skin tissue sample (biopsy) can help to confirm or rule out lupus. TREATMENT There is no cure for lupus. Your caregiver will develop a treatment plan based on your age, sex, health, symptoms, and lifestyle. The goals are to prevent flares, to treat them when they do occur, and to minimize organ damage and complications. How  the disease may affect each person varies widely. Most people with lupus can live normal lives, but this disorder must be carefully monitored. Treatment must be adjusted as necessary to prevent serious complications. Medicines used for treatment:  Nonsteroidal anti-inflammatory drugs (NSAIDs) decrease inflammation and can help with chest pain, joint pain, and fevers. Examples include ibuprofen and naproxen.  Antimalarial drugs were designed to treat malaria. They also treat fatigue, joint pain, skin rashes, and inflammation of the lungs in patients with lupus.  Corticosteroids are powerful hormones that rapidly suppress inflammation. The lowest dose with the highest benefit will be chosen. They can be given by cream, pills, injections, and through the vein (intravenously).  Immunosuppressive drugs block the making of immune cells. They may be used for kidney or nerve disease. HOME CARE INSTRUCTIONS  Exercise. Low-impact activities can usually help keep joints flexible without being too strenuous.  Rest after periods of exercise.  Avoid excessive sun exposure.  Follow proper nutrition and take supplements as recommended by your caregiver.  Stress management can be helpful. SEEK MEDICAL CARE IF:  You have increased fatigue.  You develop pain.  You develop a rash.  You have an oral temperature above 102 F (38.9 C).  You develop abdominal discomfort.  You develop a headache.  You experience dizziness. FOR MORE INFORMATION National Institute of Neurological Disorders and Stroke: [employer] of Rheumatology: www.rheumatology.[facility] of [facility] and Musculoskeletal and Skin Diseases: www.niams.http://www.myers.net/ Document Released: 02/17/2002 Document Revised: 05/22/2011 Document Reviewed: 06/10/2009 [facility] Patient Information 2013 [place], [place].  Take medications as directed. Return for any new or worse symptoms to include  fever trouble breathing.  Followup with your doctors at [facility] next week if not improved.

## 2014-03-17 IMAGING — CR DG CHEST 2V
2 series · 2 of 2 positions shown · non-contrast
Comparison: No priors.

CLINICAL DATA: Cough and congestion.

CHEST - 2 VIEW

[w chest pa]
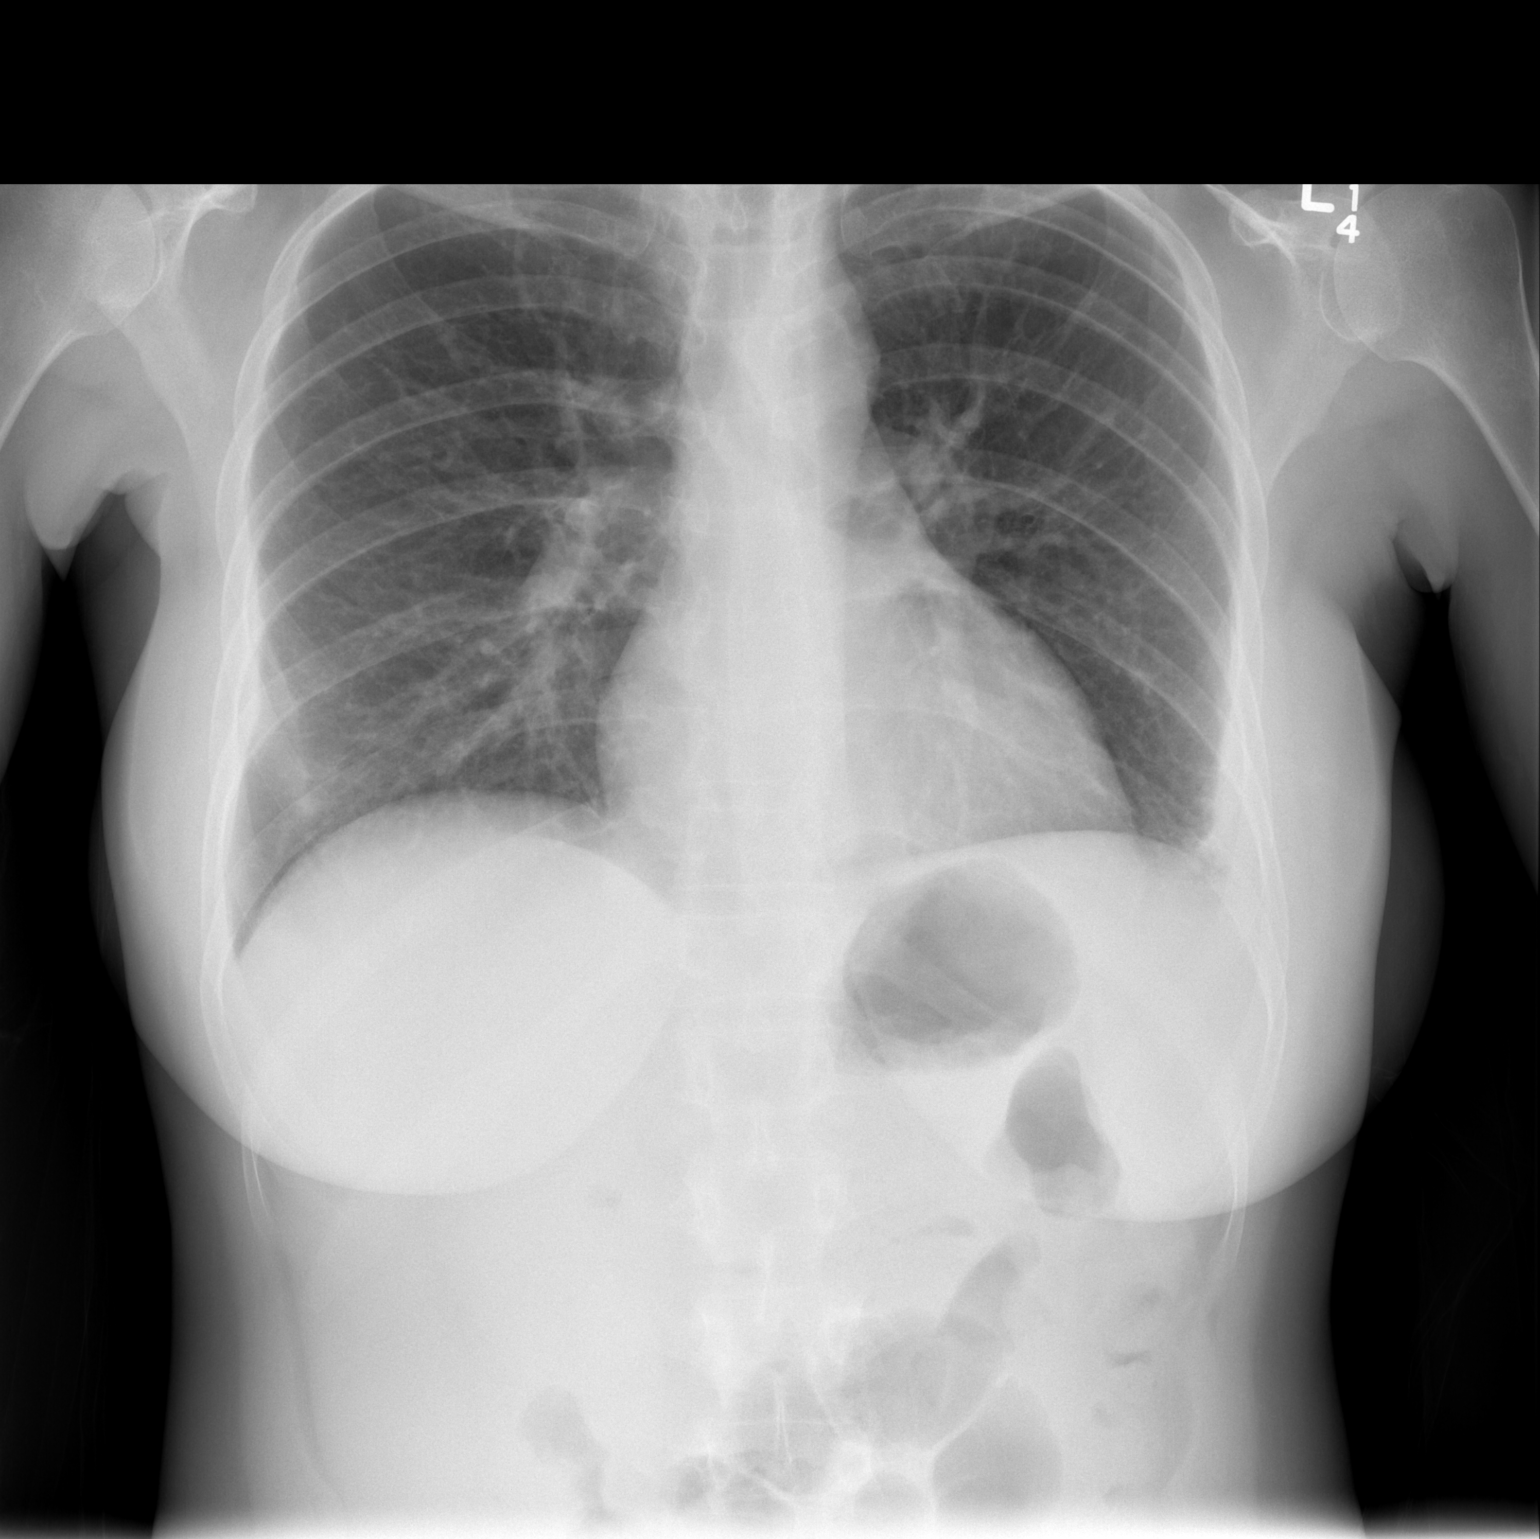

[w chest lat]
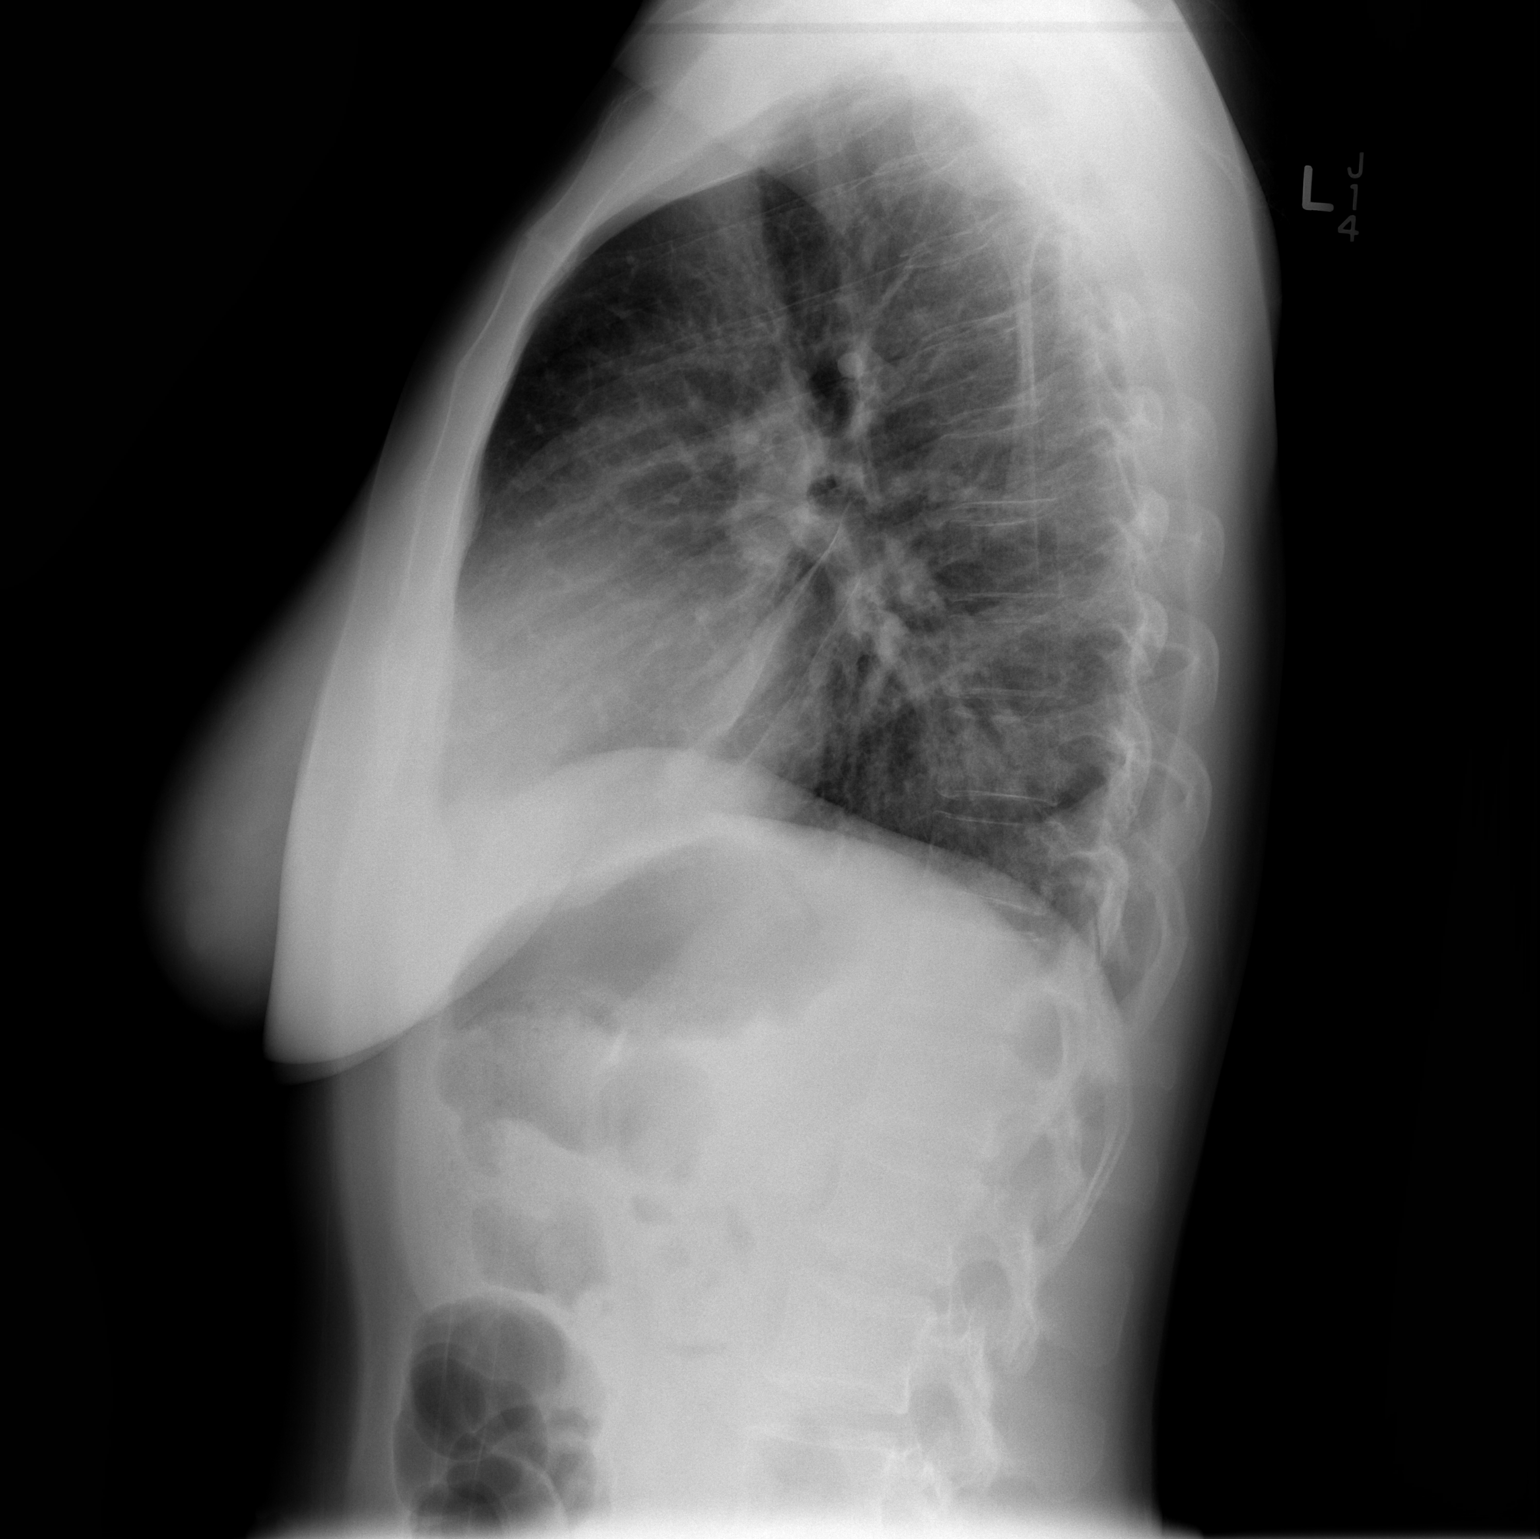

[2 of 2 positions shown; findings below may reference images not displayed]

FINDINGS: Ill-defined peripheral opacity in the right base
difficult to localize on the lateral projection.  Blunting of the
left costophrenic sulcus suggestive of a peripherally loculated
left pleural effusion.  Pulmonary vasculature is normal.  Heart
size is normal.  Mediastinal contours are unremarkable.
IMPRESSION: 1.  Ill-defined opacity in the lateral aspect of the right base may
represent an area of early airspace consolidation or scarring in
either the lateral segment of the right middle lobe or the lateral
aspect of the right lower lobe.
2. Small peripherally loculated left pleural effusion.

## 2014-03-29 ENCOUNTER — Emergency Department (HOSPITAL_BASED_OUTPATIENT_CLINIC_OR_DEPARTMENT_OTHER)
Admission: EM | Admit: 2014-03-29 | Discharge: 2014-03-29 | Discharge status: Against Medical Advice | Payer: Medicare Other | Attending: Emergency Medicine | Admitting: Emergency Medicine

## 2014-03-29 ENCOUNTER — Encounter (HOSPITAL_BASED_OUTPATIENT_CLINIC_OR_DEPARTMENT_OTHER): Payer: Self-pay

## 2014-03-29 ENCOUNTER — Emergency Department (HOSPITAL_BASED_OUTPATIENT_CLINIC_OR_DEPARTMENT_OTHER): Payer: Medicare Other

## 2014-03-29 DIAGNOSIS — M791 Myalgia: Secondary | ICD-10-CM | POA: Diagnosis present

## 2014-03-29 DIAGNOSIS — H6092 Unspecified otitis externa, left ear: Secondary | ICD-10-CM | POA: Insufficient documentation

## 2014-03-29 DIAGNOSIS — M3210 Systemic lupus erythematosus, organ or system involvement unspecified: Secondary | ICD-10-CM | POA: Diagnosis not present

## 2014-03-29 DIAGNOSIS — Z79899 Other long term (current) drug therapy: Secondary | ICD-10-CM | POA: Insufficient documentation

## 2014-03-29 DIAGNOSIS — I509 Heart failure, unspecified: Secondary | ICD-10-CM | POA: Insufficient documentation

## 2014-03-29 DIAGNOSIS — Z7982 Long term (current) use of aspirin: Secondary | ICD-10-CM | POA: Diagnosis not present

## 2014-03-29 DIAGNOSIS — Z3202 Encounter for pregnancy test, result negative: Secondary | ICD-10-CM | POA: Insufficient documentation

## 2014-03-29 DIAGNOSIS — I10 Essential (primary) hypertension: Secondary | ICD-10-CM | POA: Diagnosis not present

## 2014-03-29 DIAGNOSIS — R509 Fever, unspecified: Secondary | ICD-10-CM

## 2014-03-29 LAB — CBC WITH DIFFERENTIAL/PLATELET
BASOS ABS: 0 10*3/uL (ref 0.0–0.1)
Basophils Relative: 0 % (ref 0–1)
Eosinophils Absolute: 0.1 10*3/uL (ref 0.0–0.7)
Eosinophils Relative: 2 % (ref 0–5)
HCT: 22.1 % — ABNORMAL LOW (ref 36.0–46.0)
Hemoglobin: 6.7 g/dL — CL (ref 12.0–15.0)
LYMPHS ABS: 0.5 10*3/uL — AB (ref 0.7–4.0)
Lymphocytes Relative: 16 % (ref 12–46)
MCH: 25.3 pg — AB (ref 26.0–34.0)
MCHC: 30.3 g/dL (ref 30.0–36.0)
MCV: 83.4 fL (ref 78.0–100.0)
MONO ABS: 0.1 10*3/uL (ref 0.1–1.0)
MONOS PCT: 4 % (ref 3–12)
NEUTROS PCT: 78 % — AB (ref 43–77)
Neutro Abs: 2.5 10*3/uL (ref 1.7–7.7)
Platelets: 212 10*3/uL (ref 150–400)
RBC: 2.65 MIL/uL — AB (ref 3.87–5.11)
RDW: 18.3 % — AB (ref 11.5–15.5)
WBC: 3.2 10*3/uL — ABNORMAL LOW (ref 4.0–10.5)

## 2014-03-29 LAB — URINE MICROSCOPIC-ADD ON

## 2014-03-29 LAB — BASIC METABOLIC PANEL
Anion gap: 5 (ref 5–15)
BUN: 25 mg/dL — AB (ref 6–23)
CALCIUM: 8 mg/dL — AB (ref 8.4–10.5)
CHLORIDE: 109 meq/L (ref 96–112)
CO2: 19 mmol/L (ref 19–32)
CREATININE: 0.97 mg/dL (ref 0.50–1.10)
GFR calc non Af Amer: 76 mL/min — ABNORMAL LOW (ref >90)
GFR, EST AFRICAN AMERICAN: 89 mL/min — AB (ref >90)
Glucose, Bld: 87 mg/dL (ref 70–99)
Potassium: 4.8 mmol/L (ref 3.5–5.1)
SODIUM: 133 mmol/L — AB (ref 135–145)

## 2014-03-29 LAB — URINALYSIS, ROUTINE W REFLEX MICROSCOPIC
Bilirubin Urine: NEGATIVE
GLUCOSE, UA: NEGATIVE mg/dL
Ketones, ur: NEGATIVE mg/dL
Nitrite: NEGATIVE
PROTEIN: 100 mg/dL — AB
Specific Gravity, Urine: 1.014 (ref 1.005–1.030)
Urobilinogen, UA: 1 mg/dL (ref 0.0–1.0)
pH: 6 (ref 5.0–8.0)

## 2014-03-29 LAB — I-STAT CG4 LACTIC ACID, ED: Lactic Acid, Venous: 1.42 mmol/L (ref 0.5–2.2)

## 2014-03-29 LAB — PREGNANCY, URINE: PREG TEST UR: NEGATIVE

## 2014-03-29 MED ORDER — CIPROFLOXACIN-DEXAMETHASONE 0.3-0.1 % OT SUSP
4.0000 [drp] | Freq: Two times a day (BID) | OTIC | Status: DC
  Administered 2014-03-29 (×2): 4 [drp] via OTIC
  Filled 2014-03-29: qty 7.5

## 2014-03-29 MED ORDER — FENTANYL CITRATE 0.05 MG/ML IJ SOLN
100.0000 ug | Freq: Once | INTRAMUSCULAR | Status: AC
  Administered 2014-03-29: 100 ug via INTRAVENOUS
  Filled 2014-03-29: qty 2

## 2014-03-29 MED ORDER — ONDANSETRON HCL 4 MG/2ML IJ SOLN
4.0000 mg | Freq: Once | INTRAMUSCULAR | Status: AC
  Administered 2014-03-29: 4 mg via INTRAVENOUS
  Filled 2014-03-29: qty 2

## 2014-03-29 MED ORDER — LEVOFLOXACIN IN D5W 750 MG/150ML IV SOLN
750.0000 mg | Freq: Once | INTRAVENOUS | Status: AC
  Administered 2014-03-29: 750 mg via INTRAVENOUS
  Filled 2014-03-29: qty 150

## 2014-03-29 MED ORDER — LEVOFLOXACIN 750 MG PO TABS: ORAL_TABLET | ORAL | Status: AC

## 2014-03-29 MED ORDER — SODIUM CHLORIDE 0.9 % IV BOLUS (SEPSIS)
30.0000 mL/kg | Freq: Once | INTRAVENOUS | Status: AC
Start: 2014-03-29 — End: 2014-03-29
  Administered 2014-03-29: 1959 mL via INTRAVENOUS

## 2014-03-29 NOTE — ED Provider Notes (Signed)
CSN: [id number]     Arrival date & time 03/29/14  0037 History   First MD Initiated Contact with Patient 03/29/14 0110     Chief Complaint  Patient presents with  . Body Aches      (Consider location/radiation/quality/duration/timing/severity/associated sxs/prior Treatment) HPI This is a 34 year old female with a history of lupus. She is on chronic immunosuppressive therapy. She is here with a three-day history of generalized weakness, body aches, fever to 102, nausea, vomiting and diarrhea. Her principal complaint is body aches which are moderate to severe. She is also having pain in her left ear, worse with movement of the ear. She was noted to be febrile and tachycardic on arrival with a low pressure of 90/52. Her symptoms worsened yesterday evening and she came to the ED evaluation and treatment.  Past Medical History  Diagnosis Date  . Lupus   . CHF (congestive heart failure)   . Hypertension   . T.T.P. syndrome   . Blood transfusion without reported diagnosis   . Seizures    History reviewed. No pertinent past surgical history. History reviewed. No pertinent family history. History  Substance Use Topics  . Smoking status: Never Smoker   . Smokeless tobacco: Never Used  . Alcohol Use: No   OB History    No data available     Review of Systems  All other systems reviewed and are negative.   Allergies  Morphine and related  Home Medications   Prior to Admission medications   Medication Sig Start Date End Date Taking? Authorizing Provider  aspirin 81 MG tablet Take 81 mg by mouth daily.   Yes Historical Provider, MD  furosemide (LASIX) 20 MG tablet Take 20 mg by mouth 2 (two) times daily.   Yes Historical Provider, MD  hydroxychloroquine (PLAQUENIL) 200 MG tablet Take 200 mg by mouth 2 (two) times daily.   Yes Historical Provider, MD  lisinopril (PRINIVIL,ZESTRIL) 10 MG tablet Take 2.5 mg by mouth daily.    Yes Historical Provider, MD  Multiple Vitamin  (MULITIVITAMIN WITH MINERALS) TABS Take 1 tablet by mouth daily.   Yes Historical Provider, MD  mycophenolate (CELLCEPT) 500 MG tablet Take 1,000 mg by mouth 2 (two) times daily.   Yes Historical Provider, MD  PREDNISONE, PAK, PO Take by mouth.   Yes Historical Provider, MD  HYDROcodone-acetaminophen (NORCO/VICODIN) 5-325 MG per tablet Take 1-2 tablets by mouth every 6 (six) hours as needed for pain. 07/06/12   Vanetta MuldersScott Zackowski, MD  hydroxychloroquine (PLAQUENIL) 200 MG tablet Take 400 mg by mouth daily.    Historical Provider, MD   BP 90/52 mmHg  Pulse 124  Temp(Src) 100.8 F (38.2 C) (Oral)  Resp 16  Ht 5\' 9"  (1.753 m)  Wt 144 lb (65.318 kg)  BMI 21.26 kg/m2  SpO2 99%  LMP 03/08/2014   Physical Exam  General: Well-developed, thin female in no acute distress; appearance consistent with age of record HENT: normocephalic; atraumatic; right ear normal; left external auditory canal edematous with tenderness on movement of external ear; pharynx normal Eyes: pupils equal, round and reactive to light; extraocular muscles intact Neck: supple Heart: regular rate and rhythm; tachycardia Lungs: clear to auscultation bilaterally Abdomen: soft; nondistended; nontender; no masses or hepatosplenomegaly; bowel sounds present Extremities: No deformity; full range of motion; pulses normal Neurologic: Awake, alert and oriented; motor function intact in all extremities and symmetric; no facial droop Skin: Warm and dry; ashen color of skin Psychiatric: Flat affect    ED Course  Procedures (including critical care time)   MDM  Nursing notes and vitals signs, including pulse oximetry, reviewed.  Summary of this visit's results, reviewed by myself:  Labs:  Results for orders placed or performed during the hospital encounter of 03/29/14 (from the past 24 hour(s))  I-Stat CG4 Lactic Acid, ED     Status: None   Collection Time: 03/29/14  1:43 AM  Result Value Ref Range   Lactic Acid, Venous 1.42  0.5 - 2.2 mmol/L  CBC with Differential     Status: Abnormal   Collection Time: 03/29/14  2:34 AM  Result Value Ref Range   WBC 3.2 (L) 4.0 - 10.5 K/uL   RBC 2.65 (L) 3.87 - 5.11 MIL/uL   Hemoglobin 6.7 (LL) 12.0 - 15.0 g/dL   HCT 16.1 (L) 09.6 - 04.5 %   MCV 83.4 78.0 - 100.0 fL   MCH 25.3 (L) 26.0 - 34.0 pg   MCHC 30.3 30.0 - 36.0 g/dL   RDW 40.9 (H) 81.1 - 91.4 %   Platelets 212 150 - 400 K/uL   Neutrophils Relative % 78 (H) 43 - 77 %   Lymphocytes Relative 16 12 - 46 %   Monocytes Relative 4 3 - 12 %   Eosinophils Relative 2 0 - 5 %   Basophils Relative 0 0 - 1 %   Neutro Abs 2.5 1.7 - 7.7 K/uL   Lymphs Abs 0.5 (L) 0.7 - 4.0 K/uL   Monocytes Absolute 0.1 0.1 - 1.0 K/uL   Eosinophils Absolute 0.1 0.0 - 0.7 K/uL   Basophils Absolute 0.0 0.0 - 0.1 K/uL   RBC Morphology TEARDROP CELLS    WBC Morphology WHITE COUNT CONFIRMED ON SMEAR    Smear Review PLATELET COUNT CONFIRMED BY SMEAR   Basic metabolic panel     Status: Abnormal   Collection Time: 03/29/14  2:34 AM  Result Value Ref Range   Sodium 133 (L) 135 - 145 mmol/L   Potassium 4.8 3.5 - 5.1 mmol/L   Chloride 109 96 - 112 mEq/L   CO2 19 19 - 32 mmol/L   Glucose, Bld 87 70 - 99 mg/dL   BUN 25 (H) 6 - 23 mg/dL   Creatinine, Ser 7.82 0.50 - 1.10 mg/dL   Calcium 8.0 (L) 8.4 - 10.5 mg/dL   GFR calc non Af Amer 76 (L) >90 mL/min   GFR calc Af Amer 89 (L) >90 mL/min   Anion gap 5 5 - 15  Urinalysis, Routine w reflex microscopic     Status: Abnormal   Collection Time: 03/29/14  3:53 AM  Result Value Ref Range   Color, Urine YELLOW YELLOW   APPearance CLOUDY (A) CLEAR   Specific Gravity, Urine 1.014 1.005 - 1.030   pH 6.0 5.0 - 8.0   Glucose, UA NEGATIVE NEGATIVE mg/dL   Hgb urine dipstick SMALL (A) NEGATIVE   Bilirubin Urine NEGATIVE NEGATIVE   Ketones, ur NEGATIVE NEGATIVE mg/dL   Protein, ur 956 (A) NEGATIVE mg/dL   Urobilinogen, UA 1.0 0.0 - 1.0 mg/dL   Nitrite NEGATIVE NEGATIVE   Leukocytes, UA TRACE (A) NEGATIVE   Pregnancy, urine     Status: None   Collection Time: 03/29/14  3:53 AM  Result Value Ref Range   Preg Test, Ur NEGATIVE NEGATIVE  Urine microscopic-add on     Status: Abnormal   Collection Time: 03/29/14  3:53 AM  Result Value Ref Range   Squamous Epithelial / LPF FEW (A) RARE   WBC,  UA 7-10 <3 WBC/hpf   RBC / HPF 0-2 <3 RBC/hpf   Bacteria, UA MANY (A) RARE   Casts HYALINE CASTS (A) NEGATIVE    Imaging Studies: Dg Chest 2 View  03/29/2014   CLINICAL DATA:  Generalized weakness, fever, nausea, vomiting and diarrhea. Left ear pain and drainage. Initial encounter.  EXAM: CHEST  2 VIEW  COMPARISON:  Chest radiograph performed 01/02/2014  FINDINGS: The lungs are hypoexpanded. Vascular congestion is noted, with increased interstitial markings and bibasilar airspace opacities, concerning for pulmonary edema. Pneumonia could have a similar appearance. No definite pleural effusion or pneumothorax is seen.  The cardiomediastinal silhouette is enlarged. No acute osseous abnormalities are identified.  IMPRESSION: Lungs hypoexpanded. Vascular congestion and cardiomegaly, with increased interstitial markings and bibasilar airspace opacities, concerning for pulmonary edema. Pneumonia could have a similar appearance.   Electronically Signed   By: Roanna Raider M.D.   On: 03/29/2014 03:49   4:41 AM Admission to the hospital was advised patient and family member adamantly refused. She states she has an appointment with her [facility] doctor in 3 days and will follow up then. They were advised that she is free to return at any time should she worsen or change her mind. She states her hemoglobin was 6.7 is not unusual for her. We will place her on Levaquin for treatment of possible pneumonia and possible urinary tract infection.  Hanley Seamen, MD 03/29/14 [phone]

## 2014-03-29 NOTE — ED Notes (Signed)
Pt reports left ear pain, generalized weakness, n/v/d for 3 days. Reports history of lupus, CHF.

## 2014-03-30 LAB — URINE CULTURE
CULTURE: NO GROWTH
Colony Count: NO GROWTH

## 2014-04-04 LAB — CULTURE, BLOOD (ROUTINE X 2)
CULTURE: NO GROWTH
Culture: NO GROWTH

## 2017-01-04 ENCOUNTER — Emergency Department (HOSPITAL_BASED_OUTPATIENT_CLINIC_OR_DEPARTMENT_OTHER): Payer: Medicare Other

## 2017-01-04 ENCOUNTER — Emergency Department (HOSPITAL_BASED_OUTPATIENT_CLINIC_OR_DEPARTMENT_OTHER)
Admission: EM | Admit: 2017-01-04 | Discharge: 2017-01-04 | Disposition: A | Discharge status: Home / Self Care | Payer: Medicare Other | Attending: Emergency Medicine | Admitting: Emergency Medicine

## 2017-01-04 ENCOUNTER — Encounter (HOSPITAL_BASED_OUTPATIENT_CLINIC_OR_DEPARTMENT_OTHER): Payer: Self-pay | Admitting: *Deleted

## 2017-01-04 DIAGNOSIS — R52 Pain, unspecified: Secondary | ICD-10-CM

## 2017-01-04 DIAGNOSIS — I119 Hypertensive heart disease without heart failure: Secondary | ICD-10-CM | POA: Insufficient documentation

## 2017-01-04 DIAGNOSIS — E876 Hypokalemia: Secondary | ICD-10-CM | POA: Diagnosis not present

## 2017-01-04 DIAGNOSIS — N3090 Cystitis, unspecified without hematuria: Secondary | ICD-10-CM | POA: Insufficient documentation

## 2017-01-04 DIAGNOSIS — Z79899 Other long term (current) drug therapy: Secondary | ICD-10-CM | POA: Insufficient documentation

## 2017-01-04 DIAGNOSIS — N3000 Acute cystitis without hematuria: Secondary | ICD-10-CM

## 2017-01-04 DIAGNOSIS — R05 Cough: Secondary | ICD-10-CM | POA: Diagnosis present

## 2017-01-04 LAB — CBC WITH DIFFERENTIAL/PLATELET
BASOS ABS: 0 10*3/uL (ref 0.0–0.1)
Basophils Relative: 0 %
EOS ABS: 0.1 10*3/uL (ref 0.0–0.7)
Eosinophils Relative: 2 %
HCT: 30.4 % — ABNORMAL LOW (ref 36.0–46.0)
HEMOGLOBIN: 9.7 g/dL — AB (ref 12.0–15.0)
LYMPHS PCT: 17 %
Lymphs Abs: 0.8 10*3/uL (ref 0.7–4.0)
MCH: 28 pg (ref 26.0–34.0)
MCHC: 31.9 g/dL (ref 30.0–36.0)
MCV: 87.6 fL (ref 78.0–100.0)
MONO ABS: 0.3 10*3/uL (ref 0.1–1.0)
Monocytes Relative: 6 %
NEUTROS PCT: 75 %
Neutro Abs: 3.7 10*3/uL (ref 1.7–7.7)
PLATELETS: 176 10*3/uL (ref 150–400)
RBC: 3.47 MIL/uL — AB (ref 3.87–5.11)
RDW: 17.1 % — AB (ref 11.5–15.5)
WBC: 4.9 10*3/uL (ref 4.0–10.5)

## 2017-01-04 LAB — URINALYSIS, ROUTINE W REFLEX MICROSCOPIC
Bilirubin Urine: NEGATIVE
GLUCOSE, UA: NEGATIVE mg/dL
KETONES UR: NEGATIVE mg/dL
NITRITE: POSITIVE — AB
Protein, ur: 100 mg/dL — AB
Specific Gravity, Urine: 1.015 (ref 1.005–1.030)
pH: 7 (ref 5.0–8.0)

## 2017-01-04 LAB — URINALYSIS, MICROSCOPIC (REFLEX)

## 2017-01-04 LAB — BASIC METABOLIC PANEL
ANION GAP: 9 (ref 5–15)
BUN: 6 mg/dL (ref 6–20)
CALCIUM: 7.8 mg/dL — AB (ref 8.9–10.3)
CO2: 20 mmol/L — ABNORMAL LOW (ref 22–32)
CREATININE: 0.5 mg/dL (ref 0.44–1.00)
Chloride: 106 mmol/L (ref 101–111)
Glucose, Bld: 68 mg/dL (ref 65–99)
Potassium: 2.6 mmol/L — CL (ref 3.5–5.1)
SODIUM: 135 mmol/L (ref 135–145)

## 2017-01-04 MED ORDER — POTASSIUM CHLORIDE CRYS ER 20 MEQ PO TBCR: 20.0000 meq | EXTENDED_RELEASE_TABLET | Freq: Two times a day (BID) | ORAL | 0 refills | Status: AC

## 2017-01-04 MED ORDER — CEPHALEXIN 500 MG PO CAPS: 500.0000 mg | ORAL_CAPSULE | Freq: Two times a day (BID) | ORAL | 0 refills | Status: AC

## 2017-01-04 MED ORDER — POTASSIUM CHLORIDE CRYS ER 20 MEQ PO TBCR
40.0000 meq | EXTENDED_RELEASE_TABLET | Freq: Once | ORAL | Status: AC
  Administered 2017-01-04: 40 meq via ORAL
  Filled 2017-01-04: qty 2

## 2017-01-04 MED ORDER — OXYCODONE-ACETAMINOPHEN 5-325 MG PO TABS
1.0000 | ORAL_TABLET | Freq: Once | ORAL | Status: AC
  Administered 2017-01-04: 1 via ORAL
  Filled 2017-01-04: qty 1

## 2017-01-04 NOTE — ED Provider Notes (Signed)
Emergency Department Provider Note   I have reviewed the triage vital signs and the nursing notes.   HISTORY  Chief Complaint Generalized Body Aches and Cough   HPI Suzanne Tyler is a 36 y.o. female with PMH of HTN, Lupus, and seizures resents to the emergency department for evaluation of the past 2 months.  Patient has had a dry, nonproductive cough over that time.  No fevers or chills.  Denies chest pain or difficulty breathing.  She is tried over-the-counter cough medications with no significant improvement in symptoms.  The patient developed "pain all over"today.  Denies specific chest pain or shortness of breath.  She continues to make urine.  States that she is concerned about possible lupus flare which is presented similar to this in the past but did not involve cough at that time.  She did have her flu shot this year.  No sick contacts. Followed at [facility] for Lupus.    Past Medical History:  Diagnosis Date  . Blood transfusion without reported diagnosis   . CHF (congestive heart failure) (HCC)   . Hypertension   . Lupus   . Seizures (HCC)   . T.T.P. syndrome (HCC)     There are no active problems to display for this patient.   History reviewed. No pertinent surgical history.  Current Outpatient Rx  . Order #: [id number] Class: Historical Med  . Order #: [id number] Class: Historical Med  . Order #: [id number] Class: Print  . Order #: [id number] Class: Historical Med  . Order #: [id number] Class: Historical Med  . Order #: [id number] Class: Historical Med  . Order #: [id number] Class: Historical Med  . Order #: [id number] Class: Historical Med  . Order #: [id number] Class: Historical Med  . Order #: [id number] Class: Print  . Order #: [id number] Class: Historical Med    Allergies Dilaudid [hydromorphone] and Morphine and related  No family history on file.  Social History Social History  Substance Use Topics  . Smoking status: Never Smoker  . Smokeless tobacco: Never Used  .  Alcohol use No    Review of Systems  Constitutional: No fever/chills. Positive total body aches.  Eyes: No visual changes. ENT: No sore throat. Cardiovascular: Denies chest pain. Respiratory: Denies shortness of breath. Positive cough.  Gastrointestinal: No abdominal pain.  No nausea, no vomiting.  No diarrhea.  No constipation. Genitourinary: Negative for dysuria. Musculoskeletal: Negative for back pain. Skin: Negative for rash. Neurological: Negative for headaches, focal weakness or numbness.  10-point ROS otherwise negative.  ____________________________________________   PHYSICAL EXAM:  VITAL SIGNS: ED Triage Vitals  Enc Vitals Group     BP 01/04/17 1745 (!) 127/101     Pulse Rate 01/04/17 1745 (!) 125     Resp 01/04/17 1745 20     Temp 01/04/17 1745 99.5 F (37.5 C)     Temp Source 01/04/17 1745 Oral     SpO2 01/04/17 1745 99 %     Weight 01/04/17 1748 135 lb (61.2 kg)     Height 01/04/17 1748 5\' 10"  (1.778 m)     Pain Score 01/04/17 1741 10   Constitutional: Alert and oriented. Chronically ill-appearing but in no acute distress.  Eyes: Conjunctivae are normal.  Head: Atraumatic. Nose: No congestion/rhinnorhea. Mouth/Throat: Mucous membranes are moist.  Neck: No stridor.   Cardiovascular: Tachycardia. Good peripheral circulation. Grossly normal heart sounds. Respiratory: Normal respiratory effort.  No retractions. Lungs CTAB. Gastrointestinal: Soft and nontender. No distention.  Musculoskeletal: No lower extremity tenderness nor edema. No gross deformities of extremities. Neurologic:  Normal speech and language. No gross focal neurologic deficits are appreciated.  Skin:  Skin is warm, dry and intact. No rash noted.  ____________________________________________   LABS (all labs ordered are listed, but only abnormal results are displayed)  Labs Reviewed  BASIC METABOLIC PANEL - Abnormal; Notable for the following:       Result Value   Potassium 2.6 (*)     CO2 20 (*)    Calcium 7.8 (*)    All other components within normal limits  CBC WITH DIFFERENTIAL/PLATELET - Abnormal; Notable for the following:    RBC 3.47 (*)    Hemoglobin 9.7 (*)    HCT 30.4 (*)    RDW 17.1 (*)    All other components within normal limits  URINALYSIS, ROUTINE W REFLEX MICROSCOPIC - Abnormal; Notable for the following:    APPearance CLOUDY (*)    Hgb urine dipstick MODERATE (*)    Protein, ur 100 (*)    Nitrite POSITIVE (*)    Leukocytes, UA TRACE (*)    All other components within normal limits  URINALYSIS, MICROSCOPIC (REFLEX) - Abnormal; Notable for the following:    Bacteria, UA MANY (*)    Squamous Epithelial / LPF 6-30 (*)    All other components within normal limits   ____________________________________________  EKG   EKG Interpretation  Date/Time:  Thursday January 04 2017 19:35:05 EDT Ventricular Rate:  101 PR Interval:    QRS Duration: 90 QT Interval:  380 QTC Calculation: 493 R Axis:   -69 Text Interpretation:  Sinus tachycardia Probable left atrial enlargement Left anterior fascicular block Anterior infarct, age indeterminate No STEMI.  Confirmed by Alona BeneLong, Tamrah Victorino [phone]) on 01/04/2017 7:43:46 PM Also confirmed by Alona BeneLong, Gurshan Settlemire [phone]), editor Elita QuickWatlington, [place] [phone])  on 01/05/2017 7:48:12 AM       ____________________________________________  RADIOLOGY  Dg Chest 2 View  Result Date: 01/04/2017 CLINICAL DATA:  Patient with dry cough for 2 months. Body aches. Shortness of breath. EXAM: CHEST  2 VIEW COMPARISON:  Chest radiograph 11/10/2016. FINDINGS: Marked cardiomegaly. Bilateral perihilar interstitial opacities. Small bilateral pleural effusions. No pneumothorax. IMPRESSION: Cardiomegaly and associated pulmonary edema.  Small effusions. Electronically Signed   By: Annia Beltrew  Davis M.D.   On: 01/04/2017 18:49    ____________________________________________   PROCEDURES  Procedure(s) performed:    Procedures  None ____________________________________________   INITIAL IMPRESSION / ASSESSMENT AND PLAN / ED COURSE  Pertinent labs & imaging results that were available during my care of the patient were reviewed by me and considered in my medical decision making (see chart for details).  Patient with PMH of lupus presents to the ED with total body aching and cough w/o fever. Concern regarding possible lupus flare. Reports many similar presentations in the past. No urinary symptoms or decreased urine output/color. No specific abdominal or chest pain. Plan for labs, UA, CXR, and reassess.   07:53 PM Patient is feeling better after pain medication.  She has hypokalemia to 2.6 but no EKG changes.  Also has evidence of a urine infection.  Unclear if the hypokalemia or infection or causing symptoms.  Possibly combination.  Patient also with pulmonary edema on chest x-ray.  I recommended that the patient be [facility] for correction of hypokalemia especially in the setting of being on Lasix for known CHF but she does not want to be admitted.  She states that she is seeing her physicians there on Monday would prefer to be discharged.  I discussed the potential risks  of returning home to an unmonitored setting she understands.  She will return if symptoms worsen.  Plan to discharge with medication for UTI as well as potassium supplementation over the next several days with plan for previously scheduled follow-up on Monday.   ____________________________________________  FINAL CLINICAL IMPRESSION(S) / ED DIAGNOSES  Final diagnoses:  Hypokalemia  Acute cystitis without hematuria  Body aches     MEDICATIONS GIVEN DURING THIS VISIT:  Medications  oxyCODONE-acetaminophen (PERCOCET/ROXICET) 5-325 MG per tablet 1 tablet (1 tablet Oral Given 01/04/17 1806)  potassium chloride SA (K-DUR,KLOR-CON) CR tablet 40 mEq (40 mEq Oral Given 01/04/17 1941)     NEW OUTPATIENT MEDICATIONS STARTED  DURING THIS VISIT:  Discharge Medication List as of 01/04/2017  7:58 PM    START taking these medications   Details  cephALEXin (KEFLEX) 500 MG capsule Take 1 capsule (500 mg total) by mouth 2 (two) times daily., Starting Thu 01/04/2017, Until Thu 01/11/2017, Print    potassium chloride SA (K-DUR,KLOR-CON) 20 MEQ tablet Take 1 tablet (20 mEq total) by mouth 2 (two) times daily., Starting Thu 01/04/2017, Until Mon 01/08/2017, Print        Note:  This document was prepared using Dragon voice recognition software and may include unintentional dictation errors.  Alona Bene, MD Emergency Medicine    Aunisty Reali, Arlyss Repress, MD 01/06/17 [phone]

## 2017-01-04 NOTE — ED Triage Notes (Signed)
Dry cough and body aches x 1 day. Denies fever. Pt has had flu shot this year.

## 2017-01-04 NOTE — ED Notes (Signed)
Patient transported to X-ray 

## 2017-01-04 NOTE — Discharge Instructions (Signed)
You were seen in the ED today with low potassium and a urine infection. I recommended admission for correction of potassium and monitoring but [facility].   Return to the ED immediately with any chest pain, difficulty breathing, high fever, or other concerning symptoms.

## 2017-05-11 DEATH — deceased

## 2019-02-14 IMAGING — CR DG CHEST 2V
2 series · 2 of 2 positions shown · non-contrast
Comparison: Chest radiograph 11/10/2016.

CLINICAL DATA: Patient with dry cough for 2 months. Body aches.
Shortness of breath.

EXAM:
CHEST  2 VIEW

[w chest pa]
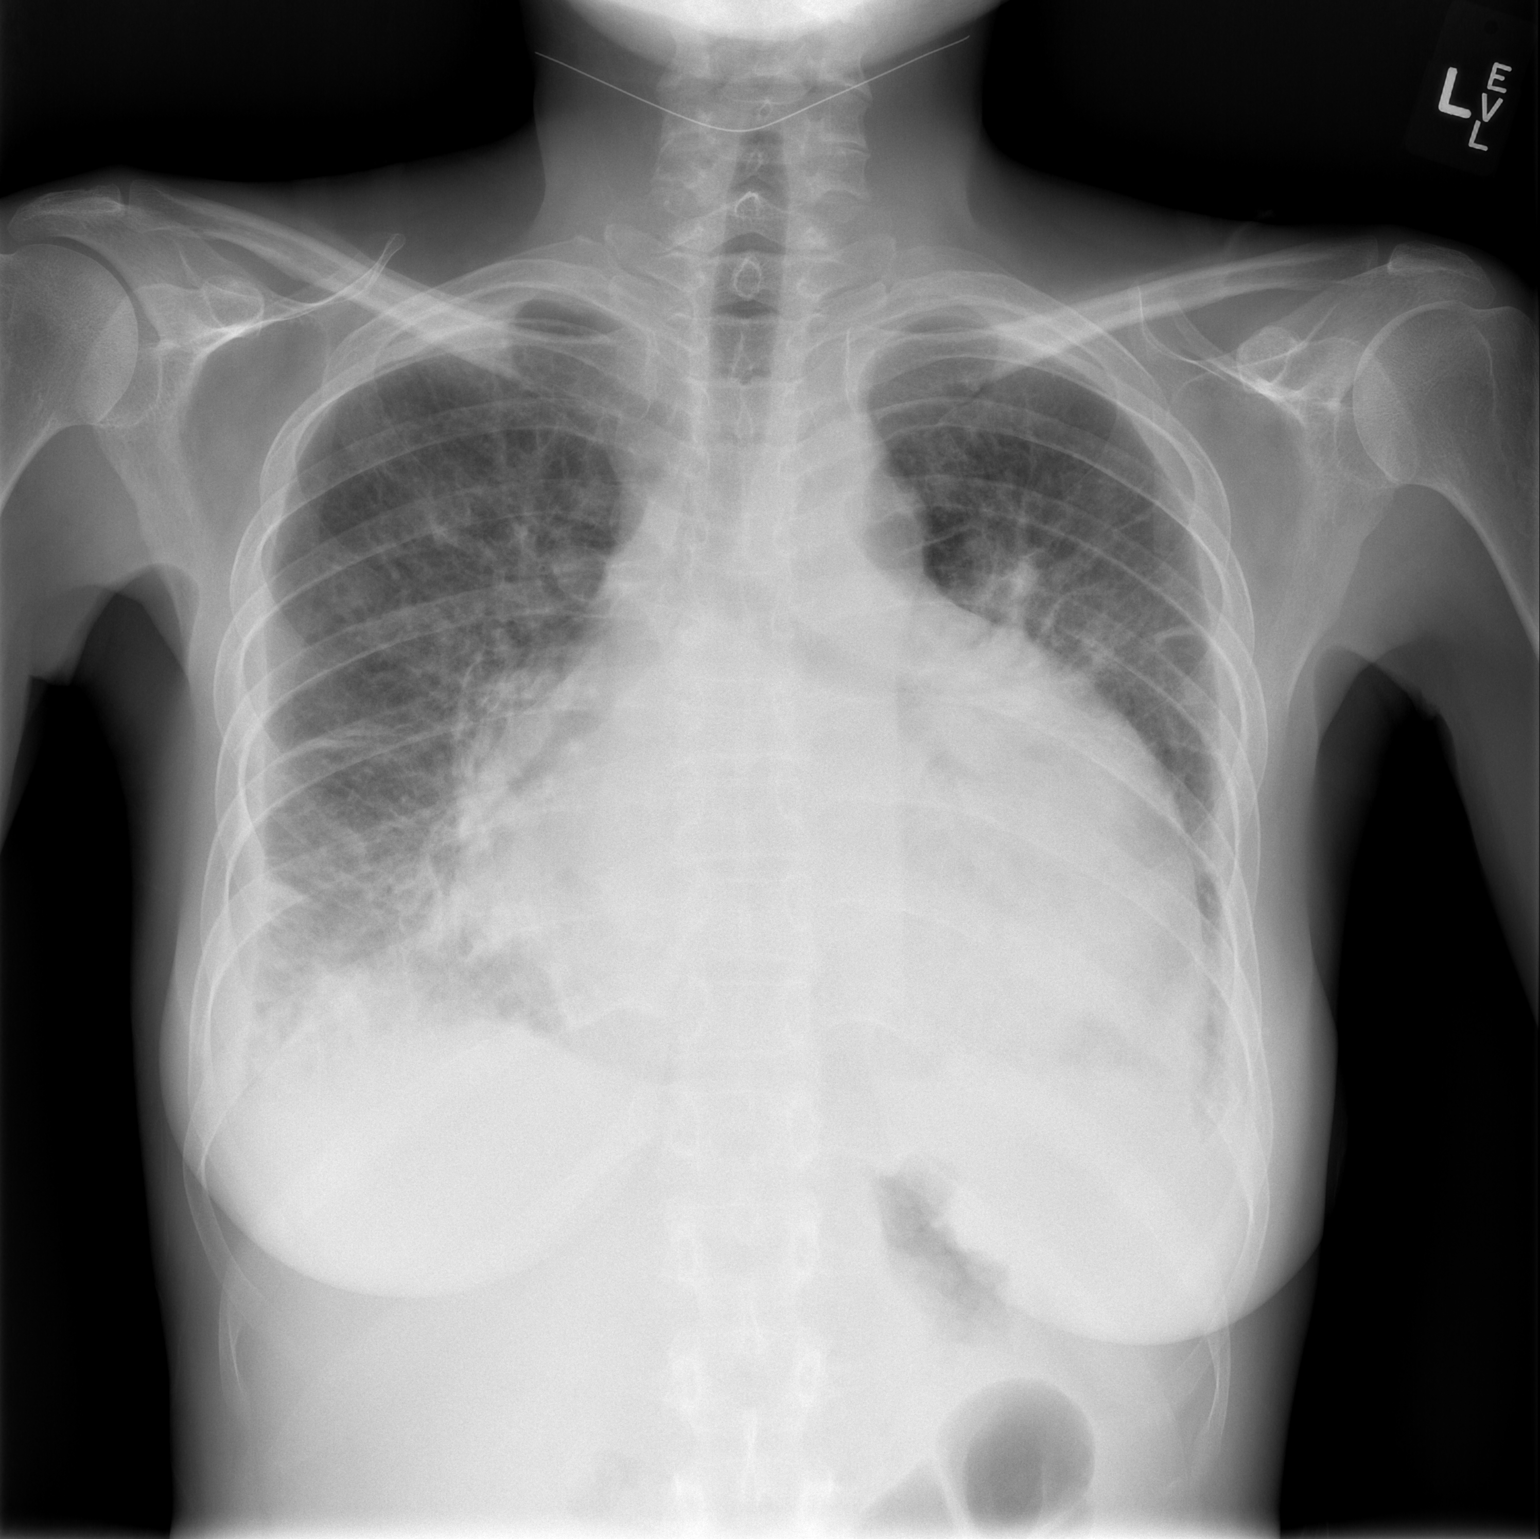

[w chest lat]
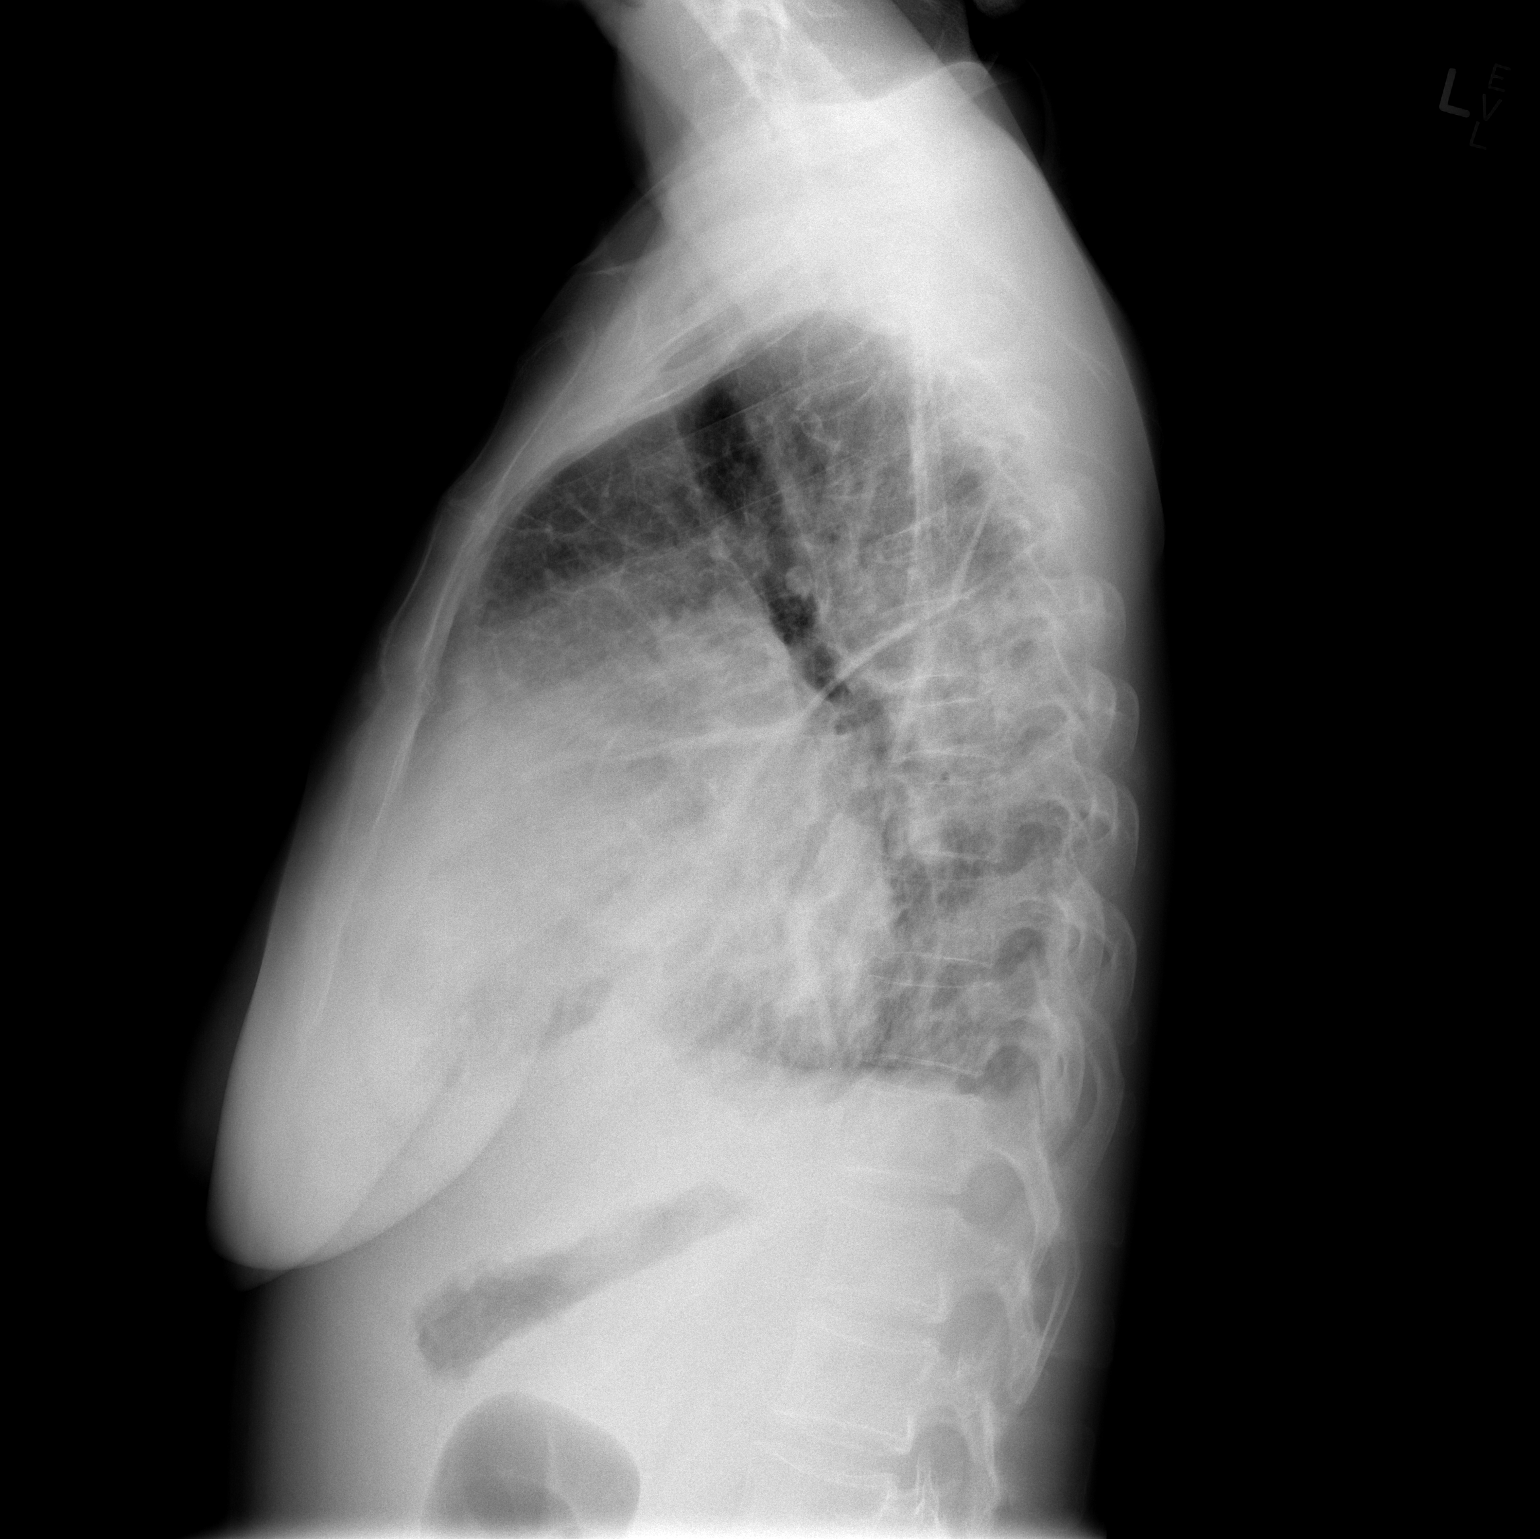

[2 of 2 positions shown; findings below may reference images not displayed]

FINDINGS: Marked cardiomegaly. Bilateral perihilar interstitial opacities.
Small bilateral pleural effusions. No pneumothorax.
IMPRESSION: Cardiomegaly and associated pulmonary edema.  Small effusions.
